# Patient Record
Sex: Male | Born: 1950
Health system: Southern US, Community
[De-identification: ages and names within clinical notes are randomized; demographics above are authoritative.]

## PROBLEM LIST (undated history)

## (undated) DIAGNOSIS — J45909 Unspecified asthma, uncomplicated: Secondary | ICD-10-CM

## (undated) DIAGNOSIS — G4730 Sleep apnea, unspecified: Secondary | ICD-10-CM

## (undated) DIAGNOSIS — E785 Hyperlipidemia, unspecified: Secondary | ICD-10-CM

## (undated) DIAGNOSIS — H409 Unspecified glaucoma: Secondary | ICD-10-CM

## (undated) DIAGNOSIS — H269 Unspecified cataract: Secondary | ICD-10-CM

## (undated) DIAGNOSIS — N189 Chronic kidney disease, unspecified: Secondary | ICD-10-CM

## (undated) DIAGNOSIS — G4733 Obstructive sleep apnea (adult) (pediatric): Secondary | ICD-10-CM

## (undated) DIAGNOSIS — M1990 Unspecified osteoarthritis, unspecified site: Secondary | ICD-10-CM

## (undated) DIAGNOSIS — C6990 Malignant neoplasm of unspecified site of unspecified eye: Secondary | ICD-10-CM

## (undated) DIAGNOSIS — I10 Essential (primary) hypertension: Secondary | ICD-10-CM

## (undated) DIAGNOSIS — C801 Malignant (primary) neoplasm, unspecified: Secondary | ICD-10-CM

## (undated) DIAGNOSIS — K219 Gastro-esophageal reflux disease without esophagitis: Secondary | ICD-10-CM

## (undated) DIAGNOSIS — E669 Obesity, unspecified: Secondary | ICD-10-CM

## (undated) HISTORY — DX: Unspecified glaucoma: H40.9

## (undated) HISTORY — PX: APPENDECTOMY: SHX54

## (undated) HISTORY — DX: Malignant neoplasm of unspecified site of unspecified eye: C69.90

## (undated) HISTORY — PX: EYE SURGERY: SHX253

## (undated) HISTORY — DX: Obstructive sleep apnea (adult) (pediatric): G47.33

## (undated) HISTORY — PX: VASECTOMY: SHX75

## (undated) HISTORY — DX: Unspecified cataract: H26.9

## (undated) HISTORY — DX: Hyperlipidemia, unspecified: E78.5

## (undated) HISTORY — DX: Obesity, unspecified: E66.9

## (undated) HISTORY — DX: Unspecified asthma, uncomplicated: J45.909

## (undated) HISTORY — PX: TRIGGER FINGER RELEASE: SHX641

---

## 2000-07-11 ENCOUNTER — Encounter (HOSPITAL_COMMUNITY): Admission: RE | Admit: 2000-07-11 | Discharge: 2000-08-10 | Payer: Self-pay | Admitting: Pulmonary Disease

## 2000-10-28 ENCOUNTER — Ambulatory Visit (HOSPITAL_COMMUNITY): Admission: RE | Admit: 2000-10-28 | Discharge: 2000-10-28 | Payer: Self-pay | Admitting: Pulmonary Disease

## 2000-12-02 ENCOUNTER — Ambulatory Visit (HOSPITAL_COMMUNITY): Admission: RE | Admit: 2000-12-02 | Discharge: 2000-12-02 | Payer: Self-pay | Admitting: Internal Medicine

## 2001-12-07 ENCOUNTER — Ambulatory Visit (HOSPITAL_COMMUNITY): Admission: RE | Admit: 2001-12-07 | Discharge: 2001-12-07 | Payer: Self-pay | Admitting: Pulmonary Disease

## 2002-11-04 ENCOUNTER — Ambulatory Visit (HOSPITAL_COMMUNITY): Admission: RE | Admit: 2002-11-04 | Discharge: 2002-11-04 | Payer: Self-pay | Admitting: Pulmonary Disease

## 2003-05-03 ENCOUNTER — Ambulatory Visit (HOSPITAL_COMMUNITY): Admission: RE | Admit: 2003-05-03 | Discharge: 2003-05-03 | Payer: Self-pay | Admitting: Ophthalmology

## 2004-02-15 ENCOUNTER — Ambulatory Visit (HOSPITAL_COMMUNITY): Admission: RE | Admit: 2004-02-15 | Discharge: 2004-02-15 | Payer: Self-pay | Admitting: Pulmonary Disease

## 2004-06-26 ENCOUNTER — Ambulatory Visit (HOSPITAL_COMMUNITY): Admission: RE | Admit: 2004-06-26 | Discharge: 2004-06-26 | Payer: Self-pay | Admitting: Pulmonary Disease

## 2004-06-27 ENCOUNTER — Ambulatory Visit (HOSPITAL_COMMUNITY): Admission: RE | Admit: 2004-06-27 | Discharge: 2004-06-27 | Payer: Self-pay | Admitting: Orthopedic Surgery

## 2004-06-27 ENCOUNTER — Ambulatory Visit (HOSPITAL_BASED_OUTPATIENT_CLINIC_OR_DEPARTMENT_OTHER): Admission: RE | Admit: 2004-06-27 | Discharge: 2004-06-27 | Payer: Self-pay | Admitting: Orthopedic Surgery

## 2004-09-17 ENCOUNTER — Ambulatory Visit (HOSPITAL_COMMUNITY): Admission: RE | Admit: 2004-09-17 | Discharge: 2004-09-17 | Payer: Self-pay | Admitting: Pulmonary Disease

## 2004-10-02 ENCOUNTER — Ambulatory Visit (HOSPITAL_COMMUNITY): Admission: RE | Admit: 2004-10-02 | Discharge: 2004-10-02 | Payer: Self-pay | Admitting: Pulmonary Disease

## 2005-01-09 ENCOUNTER — Ambulatory Visit (HOSPITAL_COMMUNITY): Admission: RE | Admit: 2005-01-09 | Discharge: 2005-01-09 | Payer: Self-pay | Admitting: Ophthalmology

## 2005-12-08 IMAGING — CR DG CHEST 2V
2 series · 2 of 2 positions shown · non-contrast
Comparison: none

CLINICAL DATA: Previously operated melanoma of the eye.  Reformed smoker.
 CHEST - 2 VIEW: 
 PA and lateral views of the chest are made and show no significant interval change compared to the previous study of 05/03/03.  There remains mild diffuse peribronchial thickening and minimal stable right apical scarring.  The heart, mediastinum, and bony thorax remain within the limits of normal.

[view not recorded (1 of 2)]
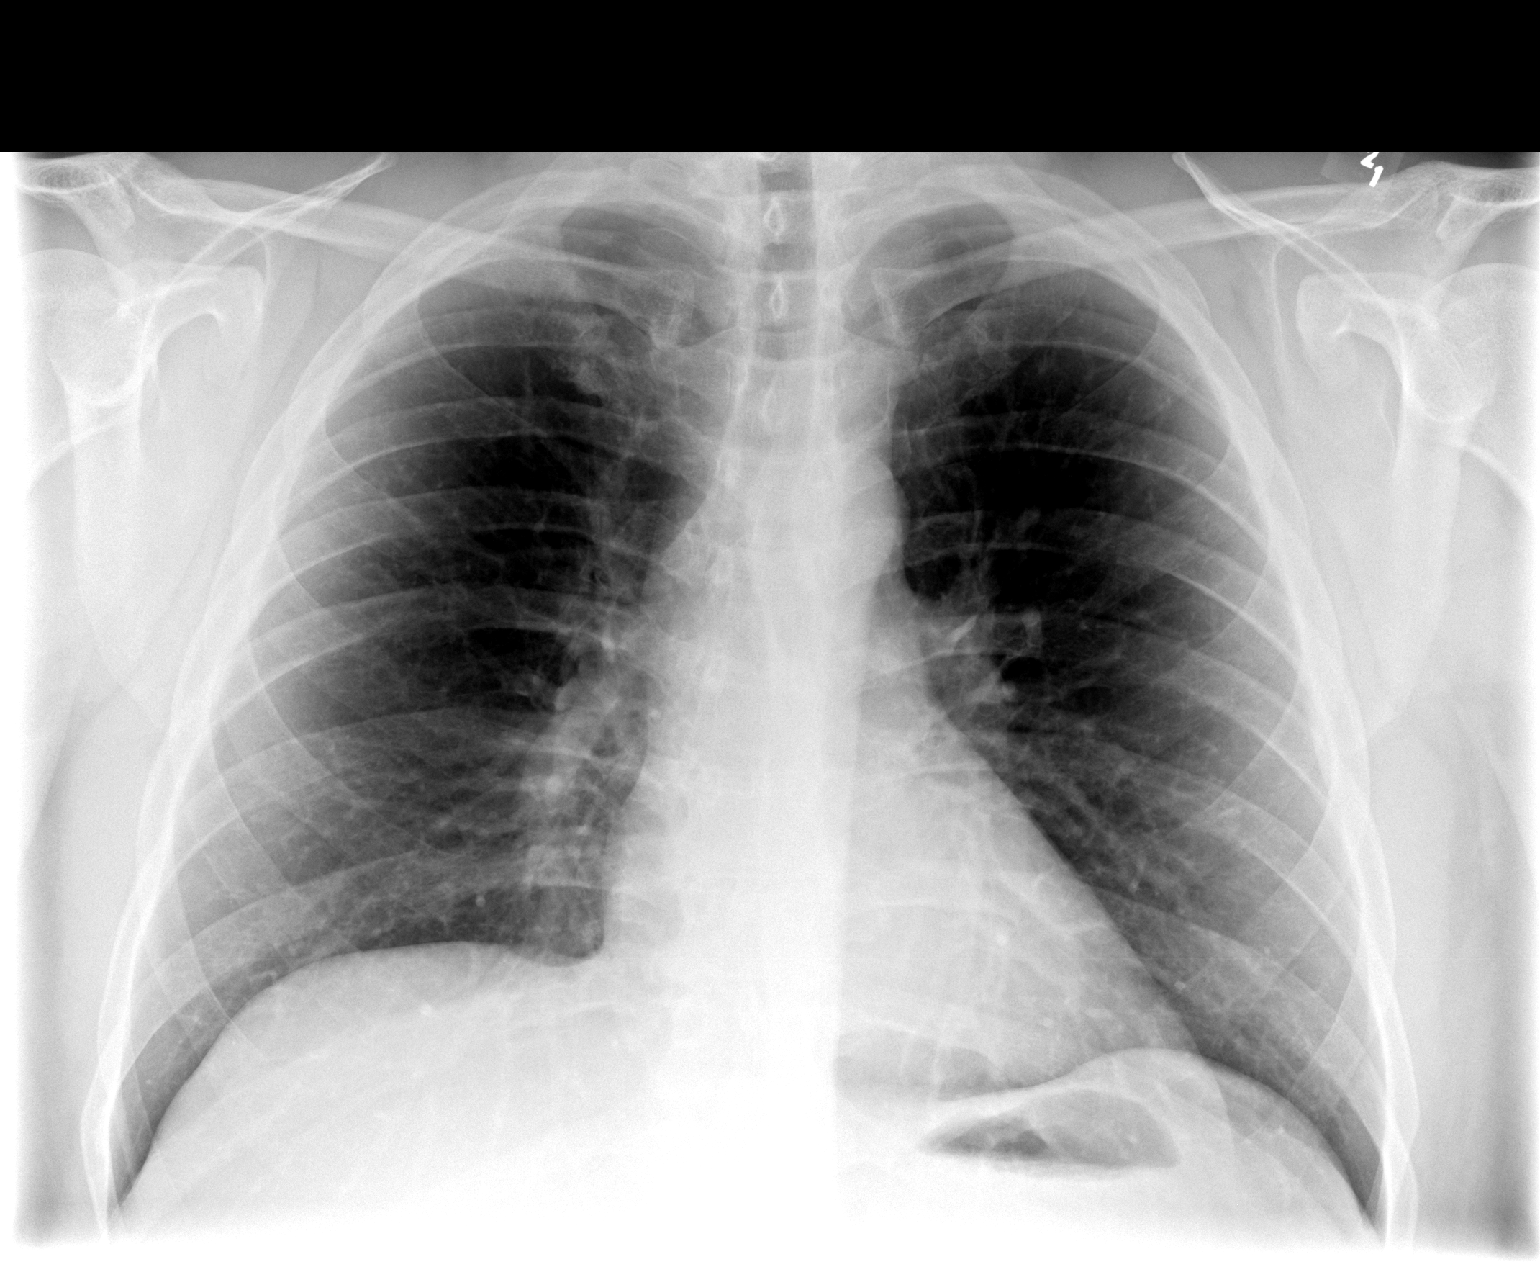

[view not recorded (2 of 2)]
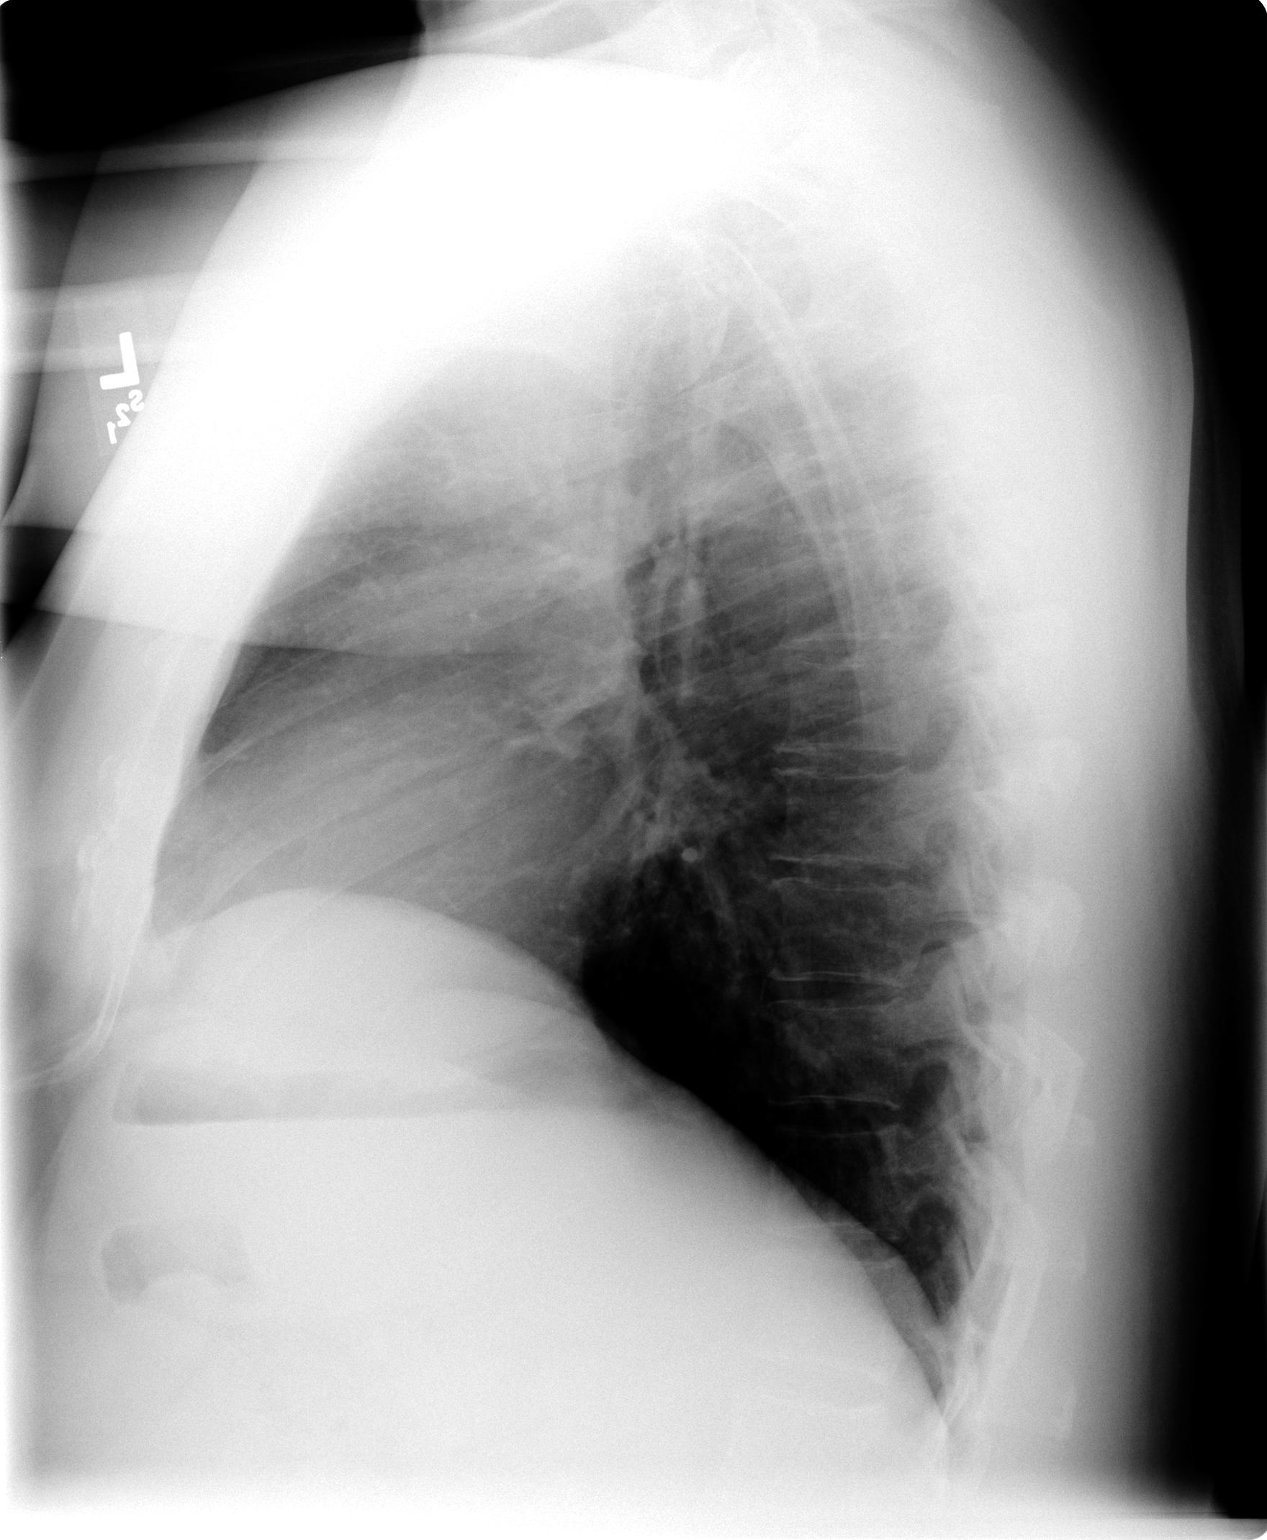

[2 of 2 positions shown; findings below may reference images not displayed]

IMPRESSION: Stable diffuse peribronchial thickening with stable scar in the medial aspect of the right apex.

## 2006-02-11 ENCOUNTER — Encounter (INDEPENDENT_AMBULATORY_CARE_PROVIDER_SITE_OTHER): Payer: Self-pay | Admitting: *Deleted

## 2006-02-11 ENCOUNTER — Ambulatory Visit: Payer: Self-pay | Admitting: Internal Medicine

## 2006-02-11 ENCOUNTER — Ambulatory Visit (HOSPITAL_COMMUNITY): Admission: RE | Admit: 2006-02-11 | Discharge: 2006-02-11 | Payer: Self-pay | Admitting: Internal Medicine

## 2006-02-18 ENCOUNTER — Ambulatory Visit (HOSPITAL_COMMUNITY): Admission: RE | Admit: 2006-02-18 | Discharge: 2006-02-18 | Payer: Self-pay | Admitting: Ophthalmology

## 2006-12-30 ENCOUNTER — Ambulatory Visit (HOSPITAL_COMMUNITY): Admission: RE | Admit: 2006-12-30 | Discharge: 2006-12-30 | Payer: Self-pay | Admitting: Ophthalmology

## 2007-12-24 ENCOUNTER — Ambulatory Visit (HOSPITAL_COMMUNITY): Admission: RE | Admit: 2007-12-24 | Discharge: 2007-12-24 | Payer: Self-pay | Admitting: Pulmonary Disease

## 2008-12-13 ENCOUNTER — Ambulatory Visit (HOSPITAL_COMMUNITY): Admission: RE | Admit: 2008-12-13 | Discharge: 2008-12-13 | Payer: Self-pay | Admitting: Pulmonary Disease

## 2010-01-11 ENCOUNTER — Ambulatory Visit (HOSPITAL_COMMUNITY): Admission: RE | Admit: 2010-01-11 | Discharge: 2010-01-11 | Payer: Self-pay | Admitting: Pulmonary Disease

## 2010-04-09 DIAGNOSIS — H3581 Retinal edema: Secondary | ICD-10-CM | POA: Insufficient documentation

## 2010-04-09 DIAGNOSIS — H43819 Vitreous degeneration, unspecified eye: Secondary | ICD-10-CM | POA: Insufficient documentation

## 2010-06-18 DIAGNOSIS — H25049 Posterior subcapsular polar age-related cataract, unspecified eye: Secondary | ICD-10-CM | POA: Insufficient documentation

## 2010-06-18 DIAGNOSIS — H3520 Other non-diabetic proliferative retinopathy, unspecified eye: Secondary | ICD-10-CM | POA: Insufficient documentation

## 2010-07-27 NOTE — Procedures (Signed)
   NAME:  Justin Montes, Justin Montes                          ACCOUNT NO.:  [id number]   MEDICAL RECORD NO.:  [id number]                   PATIENT TYPE:  OUT   LOCATION:  [facility]FTL                                 FACILITY:  [facility]   PHYSICIAN:  Edward L. Juanetta Gosling, M.D.             DATE OF BIRTH:  06-28-1950   DATE OF PROCEDURE:  12/07/2001  DATE OF DISCHARGE:                                    STRESS TEST   INDICATIONS FOR PROCEDURE:  Shortness of breath and hyperlipidemia, rule out  ischemic cardiac disease.   SUBJECTIVE:  The patient is undergoing graded exercise testing because of  shortness of breath that has been occurring with exertion and the test is  being done to rule out ischemia as a cause of this.  There are no  contraindications to undergoing graded exercise testing.   DESCRIPTION OF PROCEDURE:  The patient exercised for seven minutes 30  seconds on the Bruce protocol, reaching sustained 10.1 METS.  His maximum  recorded heart rate was 153 which is 91% of his age-predicted maximal heart  rate.  Blood pressure response to exercise was normal.  He had no symptoms  during exercise, no electrocardiographic changes suggestive of inducible  ischemia.   IMPRESSION:  1. Good exercise tolerance.  2. Normal blood pressure response to exercise.  3. No symptoms during exercise.  4. No evidence of inducible ischemia.                                               Edward L. Juanetta Gosling, M.D.    ELH/MEDQ  D:  12/07/2001  T:  12/08/2001  Job:  [id number]

## 2010-07-27 NOTE — Op Note (Signed)
NAME:  Justin Montes, Justin Montes                ACCOUNT NO.:  [id number]   MEDICAL RECORD NO.:  [id number]          PATIENT TYPE:  AMB   LOCATION:  DAY                           FACILITY:  [facility]   PHYSICIAN:  Lionel December, M.D.    DATE OF BIRTH:  12-01-1950   DATE OF PROCEDURE:  02/11/2006  DATE OF DISCHARGE:                               OPERATIVE REPORT   PROCEDURE:  Colonoscopy.   INDICATIONS:  [name] is a 60 year old Caucasian male who is here for  surveillance exam.  He had a tubular adenoma removed five years ago.  Family history is negative for colorectal carcinoma and he is free of GI  symptoms.  The procedure risks were reviewed with the patient, informed  consent was obtained.   MEDS FOR CONSCIOUS SEDATION:  Demerol 50 mg IV, Versed 5 mg IV.   FINDINGS:  The procedure was performed in the endoscopy suite.  The  patient's vital signs and O2 sat were monitored during procedure and  remained stable.  The patient was placed in the left lateral position.  Rectal examination was performed.  No abnormality was noted on external  or digital exam.  The Olympus videoscope was placed in the rectum and  advanced under vision into the sigmoid colon and beyond.  Preparation  was excellent.  The scope was passed into the cecum which was identified  by ileocecal valve and very prominent appendiceal stump which almost  looked like a polyp.  Pictures were retrieved from his last exam and  this area was very identical to the exam in September 2002.  This was  left alone.  It was previously biopsied and revealed normal mucosa.  As  the scope was withdrawn, the colonic mucosa was carefully examined.  There was a small polyp over the ileocecal valve which was ablated via  cold biopsy.  There was another small polyp at the descending colon  which was ablated in a similar fashion.  The mucosa of the rest of the  colon was normal.  Rectal mucosa similarly was normal.  The scope was  retroflexed to examine  the anorectal junction which was unremarkable.  The endoscope was straightened and withdrawn.  The patient tolerated the  procedure well.   FINAL DIAGNOSIS:  Two small polyps ablated via cold biopsy, one from the  cecum, another one from the descending colon.   RECOMMENDATIONS:  Standard instructions given.  I will be contacting the  patient with the results of the biopsy.  If the polyps are non-  adenomatous, he may consider an exam in ten years, otherwise, may come  back in 5-7.      Lionel December, M.D.  Electronically Signed     NR/MEDQ  D:  02/11/2006  T:  02/11/2006  Job:  [id number]   cc:   Ramon Dredge L. Juanetta Gosling, M.D.  Fax: [phone]

## 2010-07-27 NOTE — Op Note (Signed)
NAME:  Justin Montes, Justin Montes NO.:  [id number]   MEDICAL RECORD NO.:  [id number]          PATIENT TYPE:  AMB   LOCATION:  DSC                          FACILITY:  [facility]   PHYSICIAN:  Loreta Ave, M.D. DATE OF BIRTH:  08-Dec-1950   DATE OF PROCEDURE:  06/27/2004  DATE OF DISCHARGE:                                 OPERATIVE REPORT   PREOPERATIVE DIAGNOSES:  Triggering A1 pulley left thumb.   POSTOPERATIVE DIAGNOSES:  Triggering A1 pulley left thumb.   PROCEDURE:  Release A1 pulley left thumb.   SURGEON:  Loreta Ave, M.D.   ASSISTANT:  Genene Churn. Denton Meek.   ANESTHESIA:  IV regional.   SPECIMENS:  None.   CULTURES:  None.   COMPLICATIONS:  None.   DRESSING:  Soft compressive dressing.   DESCRIPTION OF PROCEDURE:  The patient was brought to the operating room and  after adequate anesthesia had been obtained, prepped and draped in the usual  sterile fashion. A small transverse incision volar aspect of the left thumb  over the MP joint centered over the A1 pulley. The skin and subcutaneous  tissue was divided. Neurovascular bundles identified, protected, and  retracted. A1 pulley released in its entirety proximal to distal extent  obliterating all triggering of the flexor tendon. A little attrition on the  tendon but basically intact. Once I confirmed complete obliteration of  triggering, the wound was irrigated. The skin was closed with nylon. The  margin of the wound injected with Marcaine. Sterile compressive dressing  applied. Anesthesia reversed, brought to the recovery room. Tolerated the  surgery well with no complications.      DFM/MEDQ  D:  06/27/2004  T:  06/27/2004  Job:  [id number]

## 2010-12-15 ENCOUNTER — Emergency Department (HOSPITAL_COMMUNITY): Payer: 59

## 2010-12-15 ENCOUNTER — Encounter: Payer: Self-pay | Admitting: Emergency Medicine

## 2010-12-15 ENCOUNTER — Other Ambulatory Visit: Payer: Self-pay

## 2010-12-15 ENCOUNTER — Emergency Department (HOSPITAL_COMMUNITY)
Admission: EM | Admit: 2010-12-15 | Discharge: 2010-12-15 | Disposition: A | Discharge status: Home / Self Care | Payer: 59 | Attending: Emergency Medicine | Admitting: Emergency Medicine

## 2010-12-15 DIAGNOSIS — R079 Chest pain, unspecified: Secondary | ICD-10-CM | POA: Insufficient documentation

## 2010-12-15 DIAGNOSIS — Z79899 Other long term (current) drug therapy: Secondary | ICD-10-CM | POA: Insufficient documentation

## 2010-12-15 HISTORY — DX: Malignant (primary) neoplasm, unspecified: C80.1

## 2010-12-15 LAB — CBC
MCH: 29.4 pg (ref 26.0–34.0)
MCHC: 34.3 g/dL (ref 30.0–36.0)
Platelets: 244 10*3/uL (ref 150–400)

## 2010-12-15 LAB — COMPREHENSIVE METABOLIC PANEL
ALT: 26 U/L (ref 0–53)
CO2: 27 mEq/L (ref 19–32)
Calcium: 9.2 mg/dL (ref 8.4–10.5)
Creatinine, Ser: 1.13 mg/dL (ref 0.50–1.35)
GFR calc Af Amer: 80 mL/min — ABNORMAL LOW (ref >90)
GFR calc non Af Amer: 69 mL/min — ABNORMAL LOW (ref >90)
Glucose, Bld: 122 mg/dL — ABNORMAL HIGH (ref 70–99)
Sodium: 137 mEq/L (ref 135–145)
Total Bilirubin: 0.4 mg/dL (ref 0.3–1.2)

## 2010-12-15 LAB — CARDIAC PANEL(CRET KIN+CKTOT+MB+TROPI): Total CK: 98 U/L (ref 7–232)

## 2010-12-15 MED ORDER — SODIUM CHLORIDE 0.9 % IJ SOLN: 3.0000 mL | INTRAMUSCULAR | Status: DC | PRN

## 2010-12-15 NOTE — ED Notes (Signed)
Pt c/o having a sharp chest pain one hour ago that radiated down his left arm and now c/o chest heaviness.

## 2010-12-15 NOTE — ED Provider Notes (Signed)
Scribed for Justin Hutching, MD, the patient was seen in room APA02/APA02. This chart was scribed by [employer]. The patient's care started at 09:57 CSN: [id number] Arrival date & time: 12/15/2010  9:43 AM  Chief Complaint  Patient presents with  . Chest Pain   HPI Justin Montes is a 60 y.o. male who presents to the Emergency Department complaining of sharp chest pain starting about an hour ago. Patient localizes pain to his left chest and reports it radiates into his left arm. Describes pain as sharp and stingy. Pain is currently resolved but complains of "heaviness" in the chest. "Heaviness" was initially at a 3/10 on NPS but is currently approaching 0/10. Patient denies SOB, diaphoresis or nausea. Denies tobacco use or a family history of heart problems. Per wife, patient is currently returned to baseline and looks "normal". PCP is [facility].  HPI ELEMENTS: Location: Left Chest  Onset: a.m 12/15/2010 Duration: 1 hour  Timing: intermittent Quality: "Sharp and stingy"  Context: as above  Associated symptoms: as above    Past Medical History  Diagnosis Date  . Cancer   Hypertension reflux  Past Surgical History  Procedure Date  . Eye surgery     History reviewed. No pertinent family history.  History  Substance Use Topics  . Smoking status: Not on file  . Smokeless tobacco: Not on file  . Alcohol Use: No      Review of Systems  Constitutional: Negative for diaphoresis.  Respiratory: Negative for shortness of breath.   All other systems reviewed and are negative.    Allergies  Bee venom  Home Medications  No current outpatient prescriptions on file.  BP 142/90  Pulse 95  Temp(Src) 97.9 F (36.6 C) (Oral)  Resp 18  Ht 5\' 10"  (1.778 m)  Wt 233 lb (105.688 kg)  BMI 33.43 kg/m2  SpO2 99%  Physical Exam  Nursing note and vitals reviewed. Constitutional:       Awake, alert, nontoxic appearance with baseline speech for patient.  HENT:  Head: Atraumatic.    Mouth/Throat: No oropharyngeal exudate.  Eyes: EOM are normal. Pupils are equal, round, and reactive to light. Right eye exhibits no discharge. Left eye exhibits no discharge.  Neck: Neck supple.  Cardiovascular: Normal rate, regular rhythm and normal heart sounds.   No murmur heard. Pulmonary/Chest: Effort normal and breath sounds normal. No stridor. No respiratory distress. He has no wheezes. He has no rales. He exhibits no tenderness.  Abdominal: Soft. Bowel sounds are normal. He exhibits no mass. There is no tenderness. There is no rebound.  Musculoskeletal: He exhibits no tenderness.       Baseline ROM, moves extremities with no obvious new focal weakness.  Lymphadenopathy:    He has no cervical adenopathy.  Neurological:       Awake, alert,  no facial asymmetry; tongue midline; major cranial nerves appear intact  Skin: Skin is warm and dry. No rash noted.  Psychiatric: He has a normal mood and affect.    ED Course  Procedures   OTHER DATA REVIEWED: Nursing notes, vital signs, and past medical records reviewed.    DIAGNOSTIC STUDIES: Oxygen Saturation is 99% on room air, normal by my interpretation.    Date: 12/15/2010  Rate: 94  Rhythm: normal sinus rhythm  QRS Axis: normal  Intervals: normal  ST/T Wave abnormalities: normal  Conduction Disutrbances:none  Narrative Interpretation:   Old EKG Reviewed: none available  LABS / RADIOLOGY:  Results for orders placed during the  hospital encounter of 12/15/10  CBC      Component Value Range   WBC 7.1  4.0 - 10.5 (K/uL)   RBC 5.07  4.22 - 5.81 (MIL/uL)   Hemoglobin 14.9  13.0 - 17.0 (g/dL)   HCT 56.2  13.0 - 86.5 (%)   MCV 85.8  78.0 - 100.0 (fL)   MCH 29.4  26.0 - 34.0 (pg)   MCHC 34.3  30.0 - 36.0 (g/dL)   RDW 78.4  69.6 - 29.5 (%)   Platelets 244  150 - 400 (K/uL)  POCT I-STAT TROPONIN I      Component Value Range   Troponin i, poc 0.01  0.00 - 0.08 (ng/mL)   Comment 3              Dg Chest Portable 1  View  12/15/2010  *RADIOLOGY REPORT*  Clinical Data: Chest pain  PORTABLE CHEST - 1 VIEW  Comparison: 01/11/2010  Findings: Heart size and mediastinal contours are normal.  No pleural effusion or pulmonary edema identified.  No airspace consolidation identified.  IMPRESSION:  1.  No active cardiopulmonary abnormalities.  Original Report Authenticated By: Rosealee Albee, M.D.     ED COURSE / COORDINATION OF CARE: 10:00 - EDP examined patient and discussed patient's history of present Illness and High risk factors with patient and family. EDP ordered EKG, blood work and Veterinary surgeon.  MDM: Patient had a fleeting sharp chest pain with radiation to left arm. Minimal residual heaviness. He's feeling much better in emergency department. Wife reports normal color and behavior. Screening tests normal. Recommend referral to cardiologist for stress test. These findings were discussed with patient and wife. They understand return if worse  IMPRESSION: Diagnoses that have been ruled out:  Diagnoses that are still under consideration:  Final diagnoses:     MEDICATIONS GIVEN IN THE E.D. Medications - No data to display  DISCHARGE MEDICATIONS: New Prescriptions   No medications on file    SCRIBE ATTESTATION:I personally performed the services described in this documentation, which was scribed in my presence. The recorded information has been reviewed and considered.   Justin Hutching, MD 12/15/10 1243

## 2011-02-26 ENCOUNTER — Encounter (INDEPENDENT_AMBULATORY_CARE_PROVIDER_SITE_OTHER): Payer: Self-pay | Admitting: *Deleted

## 2012-06-22 ENCOUNTER — Other Ambulatory Visit (HOSPITAL_COMMUNITY): Payer: Self-pay | Admitting: Pulmonary Disease

## 2012-06-22 ENCOUNTER — Ambulatory Visit (HOSPITAL_COMMUNITY)
Admission: RE | Admit: 2012-06-22 | Discharge: 2012-06-22 | Disposition: A | Discharge status: Home / Self Care | Payer: 59 | Source: Ambulatory Visit | Attending: Pulmonary Disease | Admitting: Pulmonary Disease

## 2012-06-22 DIAGNOSIS — R05 Cough: Secondary | ICD-10-CM | POA: Insufficient documentation

## 2012-06-22 DIAGNOSIS — R059 Cough, unspecified: Secondary | ICD-10-CM | POA: Insufficient documentation

## 2012-10-21 ENCOUNTER — Other Ambulatory Visit (HOSPITAL_COMMUNITY): Payer: Self-pay

## 2012-10-21 DIAGNOSIS — G4730 Sleep apnea, unspecified: Secondary | ICD-10-CM

## 2012-10-27 ENCOUNTER — Encounter (INDEPENDENT_AMBULATORY_CARE_PROVIDER_SITE_OTHER): Payer: Self-pay | Admitting: *Deleted

## 2012-11-05 ENCOUNTER — Encounter (INDEPENDENT_AMBULATORY_CARE_PROVIDER_SITE_OTHER): Payer: Self-pay | Admitting: *Deleted

## 2012-11-05 ENCOUNTER — Telehealth (INDEPENDENT_AMBULATORY_CARE_PROVIDER_SITE_OTHER): Payer: Self-pay | Admitting: *Deleted

## 2012-11-05 ENCOUNTER — Other Ambulatory Visit (INDEPENDENT_AMBULATORY_CARE_PROVIDER_SITE_OTHER): Payer: Self-pay | Admitting: *Deleted

## 2012-11-05 DIAGNOSIS — Z1211 Encounter for screening for malignant neoplasm of colon: Secondary | ICD-10-CM

## 2012-11-05 DIAGNOSIS — Z86010 Personal history of colonic polyps: Secondary | ICD-10-CM

## 2012-11-05 MED ORDER — PEG-KCL-NACL-NASULF-NA ASC-C 100 G PO SOLR: 1.0000 | Freq: Once | ORAL | Status: DC

## 2012-11-05 NOTE — Telephone Encounter (Signed)
Patient needs movi prep -- nkda 

## 2012-11-13 ENCOUNTER — Ambulatory Visit: Payer: 59 | Attending: Pulmonary Disease | Admitting: Sleep Medicine

## 2012-11-13 DIAGNOSIS — G4733 Obstructive sleep apnea (adult) (pediatric): Secondary | ICD-10-CM | POA: Insufficient documentation

## 2012-11-13 DIAGNOSIS — G4730 Sleep apnea, unspecified: Secondary | ICD-10-CM

## 2012-11-13 DIAGNOSIS — Z6837 Body mass index (BMI) 37.0-37.9, adult: Secondary | ICD-10-CM | POA: Insufficient documentation

## 2012-11-14 NOTE — Procedures (Signed)
[facility] Mckinze Poirier A. Gerilyn Pilgrim, MD     [email or web address]        NAME:  Justin Montes, Justin Montes                ACCOUNT NO.:  [id number]  MEDICAL RECORD NO.:  [id number]          PATIENT TYPE:  OUT  LOCATION:  SLEEP LAB                     FACILITY:  [facility]  PHYSICIAN:  Cameryn Schum A. Gerilyn Pilgrim, M.D. DATE OF BIRTH:  19-Sep-1950  DATE OF STUDY:  11/13/2012                           NOCTURNAL POLYSOMNOGRAM  REFERRING PHYSICIAN:  Ramon Dredge L. Juanetta Gosling, M.D.   INDICATION FOR STUDY:  A 62 year old man who presents with fatigue, hypersomnia, and snoring.   MEDICATIONS:  Nexium, losartan, cyclobenzaprine, atorvastatin, glucosamine, Centrum Silver, fish oil, vitamin D.  EPWORTH SLEEPINESS SCALE:  7.  BMI:  37.  ARCHITECTURAL SUMMARY:  This is a split night recording with the initial portion being a diagnostic and the second portion a titration recording. The total recording time is 434 minutes.  Sleep efficiency 80%.  Sleep latency 15 minutes.  REM latency 291 minutes.  RESPIRATORY SUMMARY:  Baseline oxygen saturation is 96.  Lowest saturation is 77 during non-REM sleep.  Diagnostic AHI is 60.  The patient was placed on positive pressure starting at 5 and increased to 10.  Optimal pressure is 10 with resolution of obstructive events and good tolerance.  LIMB MOVEMENT SUMMARY:  PLM index 0.  ELECTROCARDIOGRAM SUMMARY:  Average heart rate is 74 with no significant dysrhythmias observed.  IMPRESSION:  Severe obstructive sleep apnea syndrome, which responds well to a continuous positive airway pressure of 10.  Thanks for this referral.      Janiel Derhammer A. Gerilyn Pilgrim, M.D.    KAD/MEDQ  D:  11/14/2012 18:15:10  T:  11/14/2012 18:35:34  Job:  [id number]

## 2012-12-09 ENCOUNTER — Telehealth (INDEPENDENT_AMBULATORY_CARE_PROVIDER_SITE_OTHER): Payer: Self-pay | Admitting: *Deleted

## 2012-12-09 NOTE — Telephone Encounter (Signed)
  Procedure: tcs  Reason/Indication:  Hx polyps  Has patient had this procedure before?  Yes, 2007 (EPIC)  If so, when, by whom and where?    Is there a family history of colon cancer?  no  Who?  What age when diagnosed?    Is patient diabetic?   no      Does patient have prosthetic heart valve?  no  Do you have a pacemaker?  no  Has patient ever had endocarditis? no  Has patient had joint replacement within last 12 months?  no  Does patient tend to be constipated or take laxatives?   Is patient on Coumadin, Plavix and/or Aspirin? yes  Medications: asa 81 mg 3 times a week, losartan 50 mg daily, atorvastatin 20 mg daily, nexium 40 mg daily, cosamin DS daily, centrum silver daily, mega red daily, vit d3 daily, avstin eye injection every 10 weeks  Allergies:   Medication Adjustment: asa 2 days  Procedure date & time: 12/31/12 at 730

## 2012-12-10 NOTE — Telephone Encounter (Signed)
agree

## 2012-12-25 ENCOUNTER — Encounter (HOSPITAL_COMMUNITY): Payer: Self-pay

## 2012-12-31 ENCOUNTER — Encounter (HOSPITAL_COMMUNITY)
Admission: RE | Disposition: A | Discharge status: Home / Self Care | Payer: Self-pay | Source: Ambulatory Visit | Attending: Internal Medicine

## 2012-12-31 ENCOUNTER — Encounter (HOSPITAL_COMMUNITY): Payer: Self-pay | Admitting: *Deleted

## 2012-12-31 ENCOUNTER — Ambulatory Visit (HOSPITAL_COMMUNITY)
Admission: RE | Admit: 2012-12-31 | Discharge: 2012-12-31 | Disposition: A | Discharge status: Home / Self Care | Payer: 59 | Source: Ambulatory Visit | Attending: Internal Medicine | Admitting: Internal Medicine

## 2012-12-31 DIAGNOSIS — Z860100 Personal history of colon polyps, unspecified: Secondary | ICD-10-CM | POA: Insufficient documentation

## 2012-12-31 DIAGNOSIS — D126 Benign neoplasm of colon, unspecified: Secondary | ICD-10-CM | POA: Insufficient documentation

## 2012-12-31 DIAGNOSIS — I10 Essential (primary) hypertension: Secondary | ICD-10-CM | POA: Insufficient documentation

## 2012-12-31 HISTORY — DX: Unspecified osteoarthritis, unspecified site: M19.90

## 2012-12-31 HISTORY — DX: Hyperlipidemia, unspecified: E78.5

## 2012-12-31 HISTORY — PX: COLONOSCOPY: SHX5424

## 2012-12-31 HISTORY — DX: Gastro-esophageal reflux disease without esophagitis: K21.9

## 2012-12-31 HISTORY — DX: Sleep apnea, unspecified: G47.30

## 2012-12-31 HISTORY — DX: Essential (primary) hypertension: I10

## 2012-12-31 HISTORY — DX: Chronic kidney disease, unspecified: N18.9

## 2012-12-31 SURGERY — COLONOSCOPY
Anesthesia: Moderate Sedation

## 2012-12-31 MED ORDER — MIDAZOLAM HCL 5 MG/5ML IJ SOLN
INTRAMUSCULAR | Status: DC | PRN
  Administered 2012-12-31: 1 mg via INTRAVENOUS
  Administered 2012-12-31 (×2): 2 mg via INTRAVENOUS

## 2012-12-31 MED ORDER — MEPERIDINE HCL 50 MG/ML IJ SOLN
INTRAMUSCULAR | Status: AC
  Filled 2012-12-31: qty 1

## 2012-12-31 MED ORDER — MEPERIDINE HCL 50 MG/ML IJ SOLN
INTRAMUSCULAR | Status: DC | PRN
  Administered 2012-12-31 (×2): 25 mg via INTRAVENOUS

## 2012-12-31 MED ORDER — MIDAZOLAM HCL 5 MG/5ML IJ SOLN
INTRAMUSCULAR | Status: AC
  Filled 2012-12-31: qty 10

## 2012-12-31 MED ORDER — STERILE WATER FOR IRRIGATION IR SOLN
Status: DC | PRN
  Administered 2012-12-31: 08:00:00

## 2012-12-31 MED ORDER — SIMETHICONE 40 MG/0.6ML PO SUSP
ORAL | Status: AC
  Filled 2012-12-31: qty 0.6

## 2012-12-31 MED ORDER — SODIUM CHLORIDE 0.9 % IV SOLN
INTRAVENOUS | Status: DC
  Administered 2012-12-31: 1000 mL via INTRAVENOUS

## 2012-12-31 NOTE — H&P (Signed)
Justin Montes is an 62 y.o. male.   Chief Complaint: Patient is here for colonoscopy. HPI: Patient is 62 year old Caucasian male with history of colonic adenomas visit for surveillance colonoscopy. His last exam was in December 2007 with removal of 2 small polyps and these are serrated adenomas. He denies abdominal pain change in bowel habits or rectal bleeding. Family history is negative for colorectal carcinoma.  Past Medical History  Diagnosis Date  . Cancer     melona of the right eye  . Sleep apnea     10 on CPAP  . Hypertension   . Hyperlipemia   . Chronic kidney disease     kidney stone age 67  . GERD (gastroesophageal reflux disease)   . Arthritis     Past Surgical History  Procedure Laterality Date  . Eye surgery    . Trigger finger release      right and left thumbs  . Appendectomy      age 54    History reviewed. No pertinent family history. Social History:  reports that he quit smoking about 39 years ago. His smoking use included Cigarettes. He smoked 0.00 packs per day. He does not have any smokeless tobacco history on file. He reports that he does not drink alcohol or use illicit drugs.  Allergies:  Allergies  Allergen Reactions  . Bee Venom Swelling    Medications Prior to Admission  Medication Sig Dispense Refill  . aspirin EC 81 MG tablet Take 81 mg by mouth 3 (three) times a week. Take on Monday, Wednesday, and Friday       . atorvastatin (LIPITOR) 20 MG tablet Take 20 mg by mouth every morning.        . Cholecalciferol (VITAMIN D PO) Take 1 tablet by mouth daily.      Marland Kitchen esomeprazole (NEXIUM) 40 MG capsule Take 40 mg by mouth daily before breakfast.        . Glucosamine-Chondroit-Vit C-Mn (GLUCOSAMINE-CHONDROITIN) TABS Take 1 tablet by mouth 2 (two) times daily. Strength: 1500mg /1200mg        . Krill Oil CAPS Take 1 capsule by mouth daily. Mega Red krill capsules       . losartan (COZAAR) 50 MG tablet Take 50 mg by mouth daily.      . multivitamin  (ONE-A-DAY MEN'S) TABS Take 1 tablet by mouth every morning.        . peg 3350 powder (MOVIPREP) 100 G SOLR Take 1 kit (200 g total) by mouth once.  1 kit  0  . Bevacizumab (AVASTIN IV) Inject into the vein See admin instructions. Gets every 10 weeks        No results found for this or any previous visit (from the past 48 hour(s)). No results found.  ROS  Blood pressure 144/83, pulse 91, temperature 97.9 F (36.6 C), temperature source Oral, resp. rate 20, height 5\' 10"  (1.778 m), weight 248 lb (112.492 kg), SpO2 97.00%. Physical Exam  Constitutional: He appears well-developed and well-nourished.  HENT:  Mouth/Throat: Oropharynx is clear and moist.  Eyes: Conjunctivae are normal. No scleral icterus.  Neck: No thyromegaly present.  Cardiovascular: Normal rate, regular rhythm and normal heart sounds.   No murmur heard. Respiratory: Effort normal and breath sounds normal.  GI: Soft. He exhibits no distension and no mass. There is no tenderness.  Musculoskeletal: He exhibits no edema.  Lymphadenopathy:    He has no cervical adenopathy.  Neurological: He is alert.  Skin: Skin is warm and  dry.     Assessment/Plan History of colonic adenomas. Surveillance colonoscopy.  Justin Montes U 12/31/2012, 7:27 AM

## 2012-12-31 NOTE — Op Note (Signed)
COLONOSCOPY PROCEDURE REPORT  PATIENT:  Justin Montes  MR#:  [id number] Birthdate:  12/18/1950, 62 y.o., male Endoscopist:  Dr. Malissa Hippo, MD Referred By:  Dr. Oneal Deputy. Juanetta Gosling, MD  Procedure Date: 12/31/2012  Procedure:   Colonoscopy  Indications: Patient is 62 year old Caucasian male with history of colonic adenomas. His last exam was in December 2007 with removal of 2 small serrated adenomas.  Informed Consent:  The procedure and risks were reviewed with the patient and informed consent was obtained.  Medications:  Demerol 50 mg IV Versed 5 mg IV  Description of procedure:  After a digital rectal exam was performed, that colonoscope was advanced from the anus through the rectum and colon to the area of the cecum, ileocecal valve and appendiceal orifice. The cecum was deeply intubated. These structures were well-seen and photographed for the record. From the level of the cecum and ileocecal valve, the scope was slowly and cautiously withdrawn. The mucosal surfaces were carefully surveyed utilizing scope tip to flexion to facilitate fold flattening as needed. The scope was pulled down into the rectum where a thorough exam including retroflexion was performed.  Findings:   Prep excellent. Small periappendiceal polyp ablated via cold biopsy. Mucosa of the rest of the colon and rectum was normal. Unremarkable anorectal junction.   Therapeutic/Diagnostic Maneuvers Performed:  See above  Complications:  None  Cecal Withdrawal Time:  11 minutes  Impression:  Examination performed to cecum. Small cecal polyp ablated via cold biopsy.  Recommendations:  Standard instructions given. I will contact patient with biopsy results and further recommendations.  Maxxon Schwanke U  12/31/2012 7:56 AM  CC: Dr. Fredirick Maudlin, MD & Dr. Bonnetta Barry ref. provider found

## 2013-01-05 ENCOUNTER — Encounter (HOSPITAL_COMMUNITY): Payer: Self-pay | Admitting: Internal Medicine

## 2013-01-13 ENCOUNTER — Encounter (INDEPENDENT_AMBULATORY_CARE_PROVIDER_SITE_OTHER): Payer: Self-pay | Admitting: *Deleted

## 2013-08-17 ENCOUNTER — Emergency Department (HOSPITAL_COMMUNITY)
Admission: EM | Admit: 2013-08-17 | Discharge: 2013-08-17 | Disposition: A | Discharge status: Home / Self Care | Payer: 59 | Attending: Emergency Medicine | Admitting: Emergency Medicine

## 2013-08-17 ENCOUNTER — Encounter (HOSPITAL_COMMUNITY): Payer: Self-pay | Admitting: Emergency Medicine

## 2013-08-17 ENCOUNTER — Emergency Department (HOSPITAL_COMMUNITY): Payer: 59

## 2013-08-17 DIAGNOSIS — N201 Calculus of ureter: Secondary | ICD-10-CM | POA: Insufficient documentation

## 2013-08-17 DIAGNOSIS — M129 Arthropathy, unspecified: Secondary | ICD-10-CM | POA: Insufficient documentation

## 2013-08-17 DIAGNOSIS — Z87891 Personal history of nicotine dependence: Secondary | ICD-10-CM | POA: Insufficient documentation

## 2013-08-17 DIAGNOSIS — E785 Hyperlipidemia, unspecified: Secondary | ICD-10-CM | POA: Insufficient documentation

## 2013-08-17 DIAGNOSIS — N23 Unspecified renal colic: Secondary | ICD-10-CM | POA: Insufficient documentation

## 2013-08-17 DIAGNOSIS — I129 Hypertensive chronic kidney disease with stage 1 through stage 4 chronic kidney disease, or unspecified chronic kidney disease: Secondary | ICD-10-CM | POA: Insufficient documentation

## 2013-08-17 DIAGNOSIS — K219 Gastro-esophageal reflux disease without esophagitis: Secondary | ICD-10-CM | POA: Insufficient documentation

## 2013-08-17 DIAGNOSIS — G4730 Sleep apnea, unspecified: Secondary | ICD-10-CM | POA: Insufficient documentation

## 2013-08-17 DIAGNOSIS — Z79899 Other long term (current) drug therapy: Secondary | ICD-10-CM | POA: Insufficient documentation

## 2013-08-17 DIAGNOSIS — Z9089 Acquired absence of other organs: Secondary | ICD-10-CM | POA: Insufficient documentation

## 2013-08-17 DIAGNOSIS — N189 Chronic kidney disease, unspecified: Secondary | ICD-10-CM | POA: Insufficient documentation

## 2013-08-17 DIAGNOSIS — Z9889 Other specified postprocedural states: Secondary | ICD-10-CM | POA: Insufficient documentation

## 2013-08-17 DIAGNOSIS — Z85840 Personal history of malignant neoplasm of eye: Secondary | ICD-10-CM | POA: Insufficient documentation

## 2013-08-17 LAB — CBC WITH DIFFERENTIAL/PLATELET
BASOS ABS: 0 10*3/uL (ref 0.0–0.1)
BASOS PCT: 0 % (ref 0–1)
Eosinophils Absolute: 0 10*3/uL (ref 0.0–0.7)
Eosinophils Relative: 0 % (ref 0–5)
HEMATOCRIT: 43.2 % (ref 39.0–52.0)
Hemoglobin: 14.7 g/dL (ref 13.0–17.0)
Lymphocytes Relative: 15 % (ref 12–46)
Lymphs Abs: 1.3 10*3/uL (ref 0.7–4.0)
MCH: 29.6 pg (ref 26.0–34.0)
MCHC: 34 g/dL (ref 30.0–36.0)
MCV: 87.1 fL (ref 78.0–100.0)
MONO ABS: 0.5 10*3/uL (ref 0.1–1.0)
Monocytes Relative: 6 % (ref 3–12)
NEUTROS ABS: 6.6 10*3/uL (ref 1.7–7.7)
NEUTROS PCT: 79 % — AB (ref 43–77)
PLATELETS: 229 10*3/uL (ref 150–400)
RBC: 4.96 MIL/uL (ref 4.22–5.81)
RDW: 12.9 % (ref 11.5–15.5)
WBC: 8.4 10*3/uL (ref 4.0–10.5)

## 2013-08-17 LAB — BASIC METABOLIC PANEL
BUN: 13 mg/dL (ref 6–23)
CO2: 26 mEq/L (ref 19–32)
CREATININE: 1.16 mg/dL (ref 0.50–1.35)
Calcium: 9.1 mg/dL (ref 8.4–10.5)
Chloride: 102 mEq/L (ref 96–112)
GFR calc non Af Amer: 65 mL/min — ABNORMAL LOW (ref >90)
GFR, EST AFRICAN AMERICAN: 76 mL/min — AB (ref >90)
Glucose, Bld: 131 mg/dL — ABNORMAL HIGH (ref 70–99)
Potassium: 4.4 mEq/L (ref 3.7–5.3)
Sodium: 142 mEq/L (ref 137–147)

## 2013-08-17 LAB — URINE MICROSCOPIC-ADD ON

## 2013-08-17 LAB — URINALYSIS, ROUTINE W REFLEX MICROSCOPIC
Bilirubin Urine: NEGATIVE
GLUCOSE, UA: NEGATIVE mg/dL
Ketones, ur: NEGATIVE mg/dL
Nitrite: NEGATIVE
PROTEIN: NEGATIVE mg/dL
Specific Gravity, Urine: 1.01 (ref 1.005–1.030)
UROBILINOGEN UA: 0.2 mg/dL (ref 0.0–1.0)
pH: 6.5 (ref 5.0–8.0)

## 2013-08-17 MED ORDER — TAMSULOSIN HCL 0.4 MG PO CAPS: ORAL_CAPSULE | ORAL | Status: DC

## 2013-08-17 MED ORDER — OXYCODONE-ACETAMINOPHEN 5-325 MG PO TABS: 1.0000 | ORAL_TABLET | ORAL | Status: DC | PRN

## 2013-08-17 MED ORDER — ONDANSETRON HCL 8 MG PO TABS: 8.0000 mg | ORAL_TABLET | Freq: Three times a day (TID) | ORAL | Status: DC | PRN

## 2013-08-17 NOTE — Discharge Instructions (Signed)
Strain the urine, to catch the stone. Call the urologist for followup appointment within one week.    Kidney Stones Kidney stones (urolithiasis) are deposits that form inside your kidneys. The intense pain is caused by the stone moving through the urinary tract. When the stone moves, the ureter goes into spasm around the stone. The stone is usually passed in the urine.  CAUSES   A disorder that makes certain neck glands produce too much parathyroid hormone (primary hyperparathyroidism).  A buildup of uric acid crystals, similar to gout in your joints.  Narrowing (stricture) of the ureter.  A kidney obstruction present at birth (congenital obstruction).  Previous surgery on the kidney or ureters.  Numerous kidney infections. SYMPTOMS   Feeling sick to your stomach (nauseous).  Throwing up (vomiting).  Blood in the urine (hematuria).  Pain that usually spreads (radiates) to the groin.  Frequency or urgency of urination. DIAGNOSIS   Taking a history and physical exam.  Blood or urine tests.  CT scan.  Occasionally, an examination of the inside of the urinary bladder (cystoscopy) is performed. TREATMENT   Observation.  Increasing your fluid intake.  Extracorporeal shock wave lithotripsy This is a noninvasive procedure that uses shock waves to break up kidney stones.  Surgery may be needed if you have severe pain or persistent obstruction. There are various surgical procedures. Most of the procedures are performed with the use of small instruments. Only small incisions are needed to accommodate these instruments, so recovery time is minimized. The size, location, and chemical composition are all important variables that will determine the proper choice of action for you. Talk to your health care provider to better understand your situation so that you will minimize the risk of injury to yourself and your kidney.  HOME CARE INSTRUCTIONS   Drink enough water and fluids to  keep your urine clear or pale yellow. This will help you to pass the stone or stone fragments.  Strain all urine through the provided strainer. Keep all particulate matter and stones for your health care provider to see. The stone causing the pain may be as small as a grain of salt. It is very important to use the strainer each and every time you pass your urine. The collection of your stone will allow your health care provider to analyze it and verify that a stone has actually passed. The stone analysis will often identify what you can do to reduce the incidence of recurrences.  Only take over-the-counter or prescription medicines for pain, discomfort, or fever as directed by your health care provider.  Make a follow-up appointment with your health care provider as directed.  Get follow-up X-rays if required. The absence of pain does not always mean that the stone has passed. It may have only stopped moving. If the urine remains completely obstructed, it can cause loss of kidney function or even complete destruction of the kidney. It is your responsibility to make sure X-rays and follow-ups are completed. Ultrasounds of the kidney can show blockages and the status of the kidney. Ultrasounds are not associated with any radiation and can be performed easily in a matter of minutes. SEEK MEDICAL CARE IF:  You experience pain that is progressive and unresponsive to any pain medicine you have been prescribed. SEEK IMMEDIATE MEDICAL CARE IF:   Pain cannot be controlled with the prescribed medicine.  You have a fever or shaking chills.  The severity or intensity of pain increases over 18 hours and is not  relieved by pain medicine.  You develop a new onset of abdominal pain.  You feel faint or pass out.  You are unable to urinate. MAKE SURE YOU:   Understand these instructions.  Will watch your condition.  Will get help right away if you are not doing well or get worse. Document Released:  02/25/2005 Document Revised: 10/28/2012 Document Reviewed: 07/29/2012 [facility] Patient Information 2014 [employer].

## 2013-08-17 NOTE — ED Notes (Signed)
Woke up this am with aching pain to left side that radiated to back.  Had kidney stones about 30 years ago.  Presently rating pain 3.  Dr Eulis Foster in, wife at bedside.

## 2013-08-17 NOTE — ED Provider Notes (Signed)
CSN: [id number]     Arrival date & time 08/17/13  0930 History  This chart was scribed for Justin Blade, MD by Roe Coombs, ED Scribe. The patient was seen in room APA09/APA09. Patient's care was started at 9:50 AM.  Chief Complaint  Patient presents with  . Flank Pain    The history is provided by the patient. No language interpreter was used.    HPI Comments: Justin Montes is a 63 y.o. male who presents to the Emergency Department complaining of intermittent left flank pain that began yesterday. Pain radiates to his left lower abdomen. The pain does not radiate to his testicles. There are no known modifying factors. There is associated nausea.  Patient denies dysuria, urinary frequency, hematuria or vomiting. He denies diarrhea, fever, chills, cough, shortness of breath or chest pain. He had a kidney stone remotely. He has not had abdominal or back surgeries. He denies any obvious injury or trauma to his back.    Past Medical History  Diagnosis Date  . Cancer     melona of the right eye  . Sleep apnea     10 on CPAP  . Hypertension   . Hyperlipemia   . Chronic kidney disease     kidney stone age 35  . GERD (gastroesophageal reflux disease)   . Arthritis    Past Surgical History  Procedure Laterality Date  . Eye surgery    . Trigger finger release      right and left thumbs  . Appendectomy      age 62  . Colonoscopy N/A 12/31/2012    Procedure: COLONOSCOPY;  Surgeon: Rogene Houston, MD;  Location: [facility];  Service: Endoscopy;  Laterality: N/A;  730   History reviewed. No pertinent family history. History  Substance Use Topics  . Smoking status: Former Smoker    Types: Cigarettes    Quit date: 03/11/1973  . Smokeless tobacco: Not on file  . Alcohol Use: No    Review of Systems  Constitutional: Negative for fever and chills.  Respiratory: Negative for shortness of breath.   Cardiovascular: Negative for chest pain.  Gastrointestinal: Positive for nausea and  abdominal pain. Negative for vomiting and diarrhea.  Genitourinary: Positive for flank pain. Negative for dysuria, frequency and hematuria.  All other systems reviewed and are negative.   Allergies  Bee venom and Hydrocodone  Home Medications   Prior to Admission medications   Medication Sig Start Date End Date Taking? Authorizing Provider  atorvastatin (LIPITOR) 20 MG tablet Take 20 mg by mouth every morning.     Yes Historical Provider, MD  Coenzyme Q10 (CO Q-10) 200 MG CAPS Take 1 capsule by mouth daily.   Yes Historical Provider, MD  esomeprazole (NEXIUM) 40 MG capsule Take 40 mg by mouth daily before breakfast.     Yes Historical Provider, MD  Glucosamine-Chondroit-Vit C-Mn (GLUCOSAMINE-CHONDROITIN) TABS Take 1 tablet by mouth 2 (two) times daily. Strength: 1500mg /1200mg     Yes Historical Provider, MD  ibuprofen (ADVIL,MOTRIN) 200 MG tablet Take 600 mg by mouth every 6 (six) hours as needed for mild pain.   Yes Historical Provider, MD  Astrid Drafts CAPS Take 1 capsule by mouth daily. Mega Red krill capsules    Yes Historical Provider, MD  loratadine-pseudoephedrine (CLARITIN-D 24-HOUR) 10-240 MG per 24 hr tablet Take 1 tablet by mouth daily.   Yes Historical Provider, MD  losartan (COZAAR) 50 MG tablet Take 50 mg by mouth daily.   Yes  Historical Provider, MD  multivitamin (ONE-A-DAY MEN'S) TABS Take 1 tablet by mouth every morning.     Yes Historical Provider, MD  Bevacizumab (AVASTIN IV) Inject into the vein See admin instructions. Gets every 10 weeks    Historical Provider, MD  ondansetron (ZOFRAN) 8 MG tablet Take 1 tablet (8 mg total) by mouth every 8 (eight) hours as needed for nausea or vomiting. 08/17/13   Justin Blade, MD  oxyCODONE-acetaminophen (PERCOCET) 5-325 MG per tablet Take 1 tablet by mouth every 4 (four) hours as needed for severe pain. 08/17/13   Justin Blade, MD  tamsulosin (FLOMAX) 0.4 MG CAPS capsule 1 q HS to aid stone passage 08/17/13   Justin Blade, MD    Physical Exam  Nursing note and vitals reviewed. Constitutional: He is oriented to person, place, and time. He appears well-developed and well-nourished.  HENT:  Head: Normocephalic and atraumatic.  Right Ear: External ear normal.  Left Ear: External ear normal.  Eyes: Conjunctivae and EOM are normal. Pupils are equal, round, and reactive to light.  Neck: Normal range of motion and phonation normal. Neck supple.  Cardiovascular: Normal rate, regular rhythm, normal heart sounds and intact distal pulses.   Pulmonary/Chest: Effort normal and breath sounds normal. He exhibits no bony tenderness.  Abdominal: Soft. There is no tenderness.  No pulsatile mass.   Genitourinary:  No CVA tenderness.  Musculoskeletal: Normal range of motion.  Neurological: He is alert and oriented to person, place, and time. No cranial nerve deficit or sensory deficit. He exhibits normal muscle tone. Coordination normal.  Skin: Skin is warm, dry and intact.  Psychiatric: He has a normal mood and affect. His behavior is normal. Judgment and thought content normal.    ED Course  Procedures (including critical care time)   COORDINATION OF CARE: Medications - No data to display  Patient Vitals for the past 24 hrs:  BP Temp Temp src Pulse Resp SpO2  08/17/13 0940 156/99 mmHg 98.2 F (36.8 C) Oral 91 21 100 %   9:59 AM Reevaluation with update and discussion.  11:42 AM - Updated patient on imaging results which show left kidney stone in the ureter. Will give medicines to help patient pass stone. Also advised follow up with urology in the next 1-2 weeks.     Labs Review Labs Reviewed  URINALYSIS, ROUTINE W REFLEX MICROSCOPIC - Abnormal; Notable for the following:    Hgb urine dipstick LARGE (*)    Leukocytes, UA TRACE (*)    All other components within normal limits  CBC WITH DIFFERENTIAL - Abnormal; Notable for the following:    Neutrophils Relative % 79 (*)    All other components within normal  limits  BASIC METABOLIC PANEL - Abnormal; Notable for the following:    Glucose, Bld 131 (*)    GFR calc non Af Amer 65 (*)    GFR calc Af Amer 76 (*)    All other components within normal limits  URINE CULTURE  URINE MICROSCOPIC-ADD ON    Imaging Review Ct Abdomen Pelvis Wo Contrast  08/17/2013   CLINICAL DATA:  Flank pain  EXAM: CT ABDOMEN AND PELVIS WITHOUT CONTRAST  TECHNIQUE: Multidetector CT imaging of the abdomen and pelvis was performed following the standard protocol without oral or intravenous contrast material administration.  COMPARISON:  None.  FINDINGS: On axial slice 8 series 3, there is a 2 mm nodular opacity in the the inferior lingula. On axial slice 14 series 3, there is  a 2 mm nodular opacity abutting the pleura in the anterior segment of the left lower lobe. Elsewhere lung bases are clear.  Liver is prominent measuring 21.5 cm in length. There is diffuse fatty change in the liver. No focal liver lesions are identified on this noncontrast enhanced study. There is no appreciable biliary duct dilatation. Gallbladder wall is not thickened.  Spleen, pancreas, and adrenals appear normal.  In the right kidney, there is a parapelvic cyst measuring 1.4 x 1.0 cm. No other right renal masses are identified. There is no hydronephrosis or calculus on the right. There is no right-sided ureteral calculus or ureterectasis.  The left kidney appears mildly edematous. There is mild hydronephrosis on the left. There is no renal mass or calculus in the left kidney. There is a 4 mm calculus in the proximal left ureter at the level of L4. No other ureteral calculus is identified.  In the pelvis, the urinary bladder is largely distended. There is fat in both inguinal rings. There is no pelvic mass or fluid collection. The appendix is absent. There is no right lower quadrant inflammation.  There is no bowel obstruction. No free air or portal venous air. There is no ascites, adenopathy, or abscess in the an or  pelvis. There is atherosclerotic change in the aorta but no aneurysm. There are no blastic or lytic bone lesions. There is degenerative change in the lumbar spine.  IMPRESSION: 4 mm calculus in the left ureter at the level of L4 causing mild hydronephrosis and left renal edema. No intrarenal calculi are identified on either side. There is no hydronephrosis on the right.  The liver is enlarged with diffuse fatty change.  There are small pulmonary basilar nodular lesions as described above. Followup of these nodular opacity should be based on Fleischner Society guidelines. If the patient is at high risk for bronchogenic carcinoma, follow-up chest CT at 1 year is recommended. If the patient is at low risk, no follow-up is needed. This recommendation follows the consensus statement: Guidelines for Management of Small Pulmonary Nodules Detected on CT Scans: A Statement from the [employer] as published in Radiology 2005; 237:395-400.  No abscess.  No bowel obstruction.  Appendix absent.   Electronically Signed   By: Lowella Grip M.D.   On: 08/17/2013 10:42     EKG Interpretation None      MDM   Final diagnoses:  Ureteral colic  Ureteral stone    Ureteral colic with small stone that should pass, within 72 hours. No evidence for urinary tract infection, systemic infection, or acute renal insufficiency  Nursing Notes Reviewed/ Care Coordinated Applicable Imaging Reviewed Interpretation of Laboratory Data incorporated into ED treatment  The patient appears reasonably screened and/or stabilized for discharge and I doubt any other medical condition or other [facility] requiring further screening, evaluation, or treatment in the ED at this time prior to discharge.  Plan: Home Medications- Percocet, Flomax; Home Treatments- rest, strain fluid; return here if the recommended treatment, does not improve the symptoms; Recommended follow up- Urology 1 week and prn  I personally performed the services  described in this documentation, which was scribed in my presence. The recorded information has been reviewed and is accurate.       Justin Blade, MD 08/17/13 [phone]

## 2013-08-17 NOTE — ED Notes (Signed)
Pt had sudden left lower back pain that radiated around side and left side abd. Nausea at times with the "episodes" per pt. Denies urinary changes. nad at this time.

## 2013-08-18 LAB — URINE CULTURE
COLONY COUNT: NO GROWTH
Culture: NO GROWTH

## 2013-09-08 ENCOUNTER — Other Ambulatory Visit (HOSPITAL_COMMUNITY): Payer: Self-pay | Admitting: Pulmonary Disease

## 2013-09-08 DIAGNOSIS — R911 Solitary pulmonary nodule: Secondary | ICD-10-CM

## 2013-09-12 ENCOUNTER — Encounter (HOSPITAL_COMMUNITY): Payer: Self-pay | Admitting: Emergency Medicine

## 2013-09-12 ENCOUNTER — Emergency Department (HOSPITAL_COMMUNITY)
Admission: EM | Admit: 2013-09-12 | Discharge: 2013-09-12 | Disposition: A | Discharge status: Home / Self Care | Payer: 59 | Attending: Emergency Medicine | Admitting: Emergency Medicine

## 2013-09-12 DIAGNOSIS — Z9981 Dependence on supplemental oxygen: Secondary | ICD-10-CM | POA: Insufficient documentation

## 2013-09-12 DIAGNOSIS — N23 Unspecified renal colic: Secondary | ICD-10-CM | POA: Insufficient documentation

## 2013-09-12 DIAGNOSIS — Z9889 Other specified postprocedural states: Secondary | ICD-10-CM | POA: Insufficient documentation

## 2013-09-12 DIAGNOSIS — E785 Hyperlipidemia, unspecified: Secondary | ICD-10-CM | POA: Insufficient documentation

## 2013-09-12 DIAGNOSIS — Z87442 Personal history of urinary calculi: Secondary | ICD-10-CM | POA: Insufficient documentation

## 2013-09-12 DIAGNOSIS — K219 Gastro-esophageal reflux disease without esophagitis: Secondary | ICD-10-CM | POA: Insufficient documentation

## 2013-09-12 DIAGNOSIS — M129 Arthropathy, unspecified: Secondary | ICD-10-CM | POA: Insufficient documentation

## 2013-09-12 DIAGNOSIS — G4730 Sleep apnea, unspecified: Secondary | ICD-10-CM | POA: Insufficient documentation

## 2013-09-12 DIAGNOSIS — Z9089 Acquired absence of other organs: Secondary | ICD-10-CM | POA: Insufficient documentation

## 2013-09-12 DIAGNOSIS — I129 Hypertensive chronic kidney disease with stage 1 through stage 4 chronic kidney disease, or unspecified chronic kidney disease: Secondary | ICD-10-CM | POA: Insufficient documentation

## 2013-09-12 DIAGNOSIS — Z85820 Personal history of malignant melanoma of skin: Secondary | ICD-10-CM | POA: Insufficient documentation

## 2013-09-12 DIAGNOSIS — Z87891 Personal history of nicotine dependence: Secondary | ICD-10-CM | POA: Insufficient documentation

## 2013-09-12 DIAGNOSIS — N189 Chronic kidney disease, unspecified: Secondary | ICD-10-CM | POA: Insufficient documentation

## 2013-09-12 LAB — URINALYSIS, ROUTINE W REFLEX MICROSCOPIC
BILIRUBIN URINE: NEGATIVE
Glucose, UA: NEGATIVE mg/dL
Leukocytes, UA: NEGATIVE
Nitrite: NEGATIVE
Specific Gravity, Urine: 1.025 (ref 1.005–1.030)
UROBILINOGEN UA: 0.2 mg/dL (ref 0.0–1.0)
pH: 5.5 (ref 5.0–8.0)

## 2013-09-12 LAB — URINE MICROSCOPIC-ADD ON

## 2013-09-12 MED ORDER — METOCLOPRAMIDE HCL 10 MG PO TABS: 10.0000 mg | ORAL_TABLET | Freq: Four times a day (QID) | ORAL | Status: DC | PRN

## 2013-09-12 MED ORDER — HYDROMORPHONE HCL PF 2 MG/ML IJ SOLN
2.0000 mg | Freq: Once | INTRAMUSCULAR | Status: AC
  Administered 2013-09-12: 2 mg via INTRAMUSCULAR
  Filled 2013-09-12: qty 1

## 2013-09-12 MED ORDER — CEPHALEXIN 500 MG PO CAPS: ORAL_CAPSULE | ORAL | Status: DC

## 2013-09-12 MED ORDER — KETOROLAC TROMETHAMINE 30 MG/ML IJ SOLN
30.0000 mg | Freq: Once | INTRAMUSCULAR | Status: AC
  Administered 2013-09-12: 30 mg via INTRAMUSCULAR
  Filled 2013-09-12: qty 1

## 2013-09-12 MED ORDER — HYDROMORPHONE HCL 4 MG PO TABS: 4.0000 mg | ORAL_TABLET | ORAL | Status: DC | PRN

## 2013-09-12 MED ORDER — ONDANSETRON 8 MG PO TBDP
8.0000 mg | ORAL_TABLET | Freq: Once | ORAL | Status: AC
  Administered 2013-09-12: 8 mg via ORAL
  Filled 2013-09-12: qty 1

## 2013-09-12 MED ORDER — ONDANSETRON 8 MG PO TBDP: ORAL_TABLET | ORAL | Status: DC

## 2013-09-12 NOTE — ED Notes (Signed)
Dr Stevie Kern with ultrasound at bedside.

## 2013-09-12 NOTE — ED Provider Notes (Signed)
CSN: [id number]     Arrival date & time 09/12/13  0900 History  This chart was scribed for Babette Relic, MD by Girtha Hake, ED Scribe. The patient was seen in APA04/APA04. The patient's care was started at 9:13 AM.     Chief Complaint  Patient presents with  . Flank Pain    Patient is a 63 y.o. male presenting with flank pain. The history is provided by the patient. No language interpreter was used.  Flank Pain  HPI Comments: Justin Montes is a 63 y.o. male with a history of kidney stones who presents to the Emergency Department complaining of sudden onset, severe, colicky left flank pain. He states that the pain is currently a 5/10 but was more severe earlier this morning. He reports associated nausea and two episodes of vomiting. Patient reports that he took Percocet at 3:30am and 7:00am this morning with no relief of pain. Patient experienced similar pain one month ago. He was diagnosed with a kidney stone but he did not pass the stone. Patient denies fever, confusion, testicular pain, SOB, CP, or painful urination.   PCP is Dr. Luan Pulling.   Past Medical History  Diagnosis Date  . Cancer     melona of the right eye  . Sleep apnea     10 on CPAP  . Hypertension   . Hyperlipemia   . Chronic kidney disease     kidney stone age 21  . GERD (gastroesophageal reflux disease)   . Arthritis    Past Surgical History  Procedure Laterality Date  . Eye surgery    . Trigger finger release      right and left thumbs  . Appendectomy      age 22  . Colonoscopy N/A 12/31/2012    Procedure: COLONOSCOPY;  Surgeon: Rogene Houston, MD;  Location: [facility];  Service: Endoscopy;  Laterality: N/A;  730   No family history on file. History  Substance Use Topics  . Smoking status: Former Smoker    Types: Cigarettes    Quit date: 03/11/1973  . Smokeless tobacco: Not on file  . Alcohol Use: No    Review of Systems  Genitourinary: Positive for flank pain.  10 Systems reviewed and  are negative for acute change except as noted in the HPI.     Allergies  Bee venom and Hydrocodone  Home Medications   Prior to Admission medications   Medication Sig Start Date End Date Taking? Authorizing Provider  atorvastatin (LIPITOR) 20 MG tablet Take 20 mg by mouth daily.    Yes Historical Provider, MD  Bevacizumab (AVASTIN IV) Inject into the vein See admin instructions. Gets every 8 weeks   Yes Historical Provider, MD  Coenzyme Q10 (CO Q-10) 200 MG CAPS Take 1 capsule by mouth daily.   Yes Historical Provider, MD  esomeprazole (NEXIUM) 40 MG capsule Take 40 mg by mouth daily before breakfast.     Yes Historical Provider, MD  Glucosamine-Chondroit-Vit C-Mn (GLUCOSAMINE-CHONDROITIN) TABS Take 1 tablet by mouth 2 (two) times daily. Strength: 1500mg /1200mg     Yes Historical Provider, MD  ibuprofen (ADVIL,MOTRIN) 200 MG tablet Take 600 mg by mouth every 6 (six) hours as needed for mild pain.   Yes Historical Provider, MD  Astrid Drafts CAPS Take 1 capsule by mouth daily. Mega Red krill capsules    Yes Historical Provider, MD  losartan (COZAAR) 50 MG tablet Take 50 mg by mouth daily.   Yes Historical Provider, MD  multivitamin (ONE-A-DAY MEN'S) TABS Take 1 tablet by mouth every morning.     Yes Historical Provider, MD  oxyCODONE-acetaminophen (PERCOCET) 5-325 MG per tablet Take 1 tablet by mouth every 4 (four) hours as needed for severe pain. 08/17/13  Yes Richarda Blade, MD  cephALEXin (KEFLEX) 500 MG capsule 2 caps po bid x 7 days 09/12/13   Babette Relic, MD  HYDROmorphone (DILAUDID) 4 MG tablet Take 1 tablet (4 mg total) by mouth every 4 (four) hours as needed for severe pain. 09/12/13   Babette Relic, MD  metoCLOPramide (REGLAN) 10 MG tablet Take 1 tablet (10 mg total) by mouth every 6 (six) hours as needed for nausea (nausea/headache). 09/12/13   Babette Relic, MD  ondansetron (ZOFRAN ODT) 8 MG disintegrating tablet 8mg  ODT q4 hours prn nausea 09/12/13   Babette Relic, MD   Triage Vitals: BP  151/90  Pulse 84  Temp(Src) 97.9 F (36.6 C) (Oral)  Resp 16  Ht 5\' 10"  (1.778 m)  Wt 255 lb (115.667 kg)  BMI 36.59 kg/m2  SpO2 99% Physical Exam  Nursing note and vitals reviewed. Constitutional:  Awake, alert, nontoxic appearance.  HENT:  Head: Atraumatic.  Mouth/Throat: No oropharyngeal exudate.  Eyes: EOM are normal. Pupils are equal, round, and reactive to light. Right eye exhibits no discharge. Left eye exhibits no discharge.  Neck: Neck supple.  Cardiovascular: Normal rate and regular rhythm.   No murmur heard. Pulmonary/Chest: Effort normal and breath sounds normal. No stridor. No respiratory distress. He has no wheezes. He has no rales. He exhibits no tenderness.  Abdominal: Soft. Bowel sounds are normal. He exhibits no mass. There is tenderness (Mild left lower quadrant tenderness.). There is no rebound.  Positive left CVAT.  Genitourinary:  No scrotal tenderness.  Musculoskeletal: He exhibits no tenderness.  Baseline ROM, no obvious new focal weakness.  Lymphadenopathy:    He has no cervical adenopathy.  Neurological:  Mental status and motor strength appears baseline for patient and situation.  Skin: No rash noted.  Psychiatric: He has a normal mood and affect.    ED Course  Procedures (including critical care time) DIAGNOSTIC STUDIES: Oxygen Saturation is 99% on room air, normal by my interpretation.    COORDINATION OF CARE: 1:16 PM - Patient / Family / Caregiver understand and agree with initial ED impression and plan with expectations set for ED visit.  Pt feels improved after observation and/or treatment in ED.  Labs Review Labs Reviewed  URINALYSIS, ROUTINE W REFLEX MICROSCOPIC - Abnormal; Notable for the following:    Hgb urine dipstick LARGE (*)    Ketones, ur TRACE (*)    Protein, ur TRACE (*)    All other components within normal limits  URINE MICROSCOPIC-ADD ON - Abnormal; Notable for the following:    Squamous Epithelial / LPF FEW (*)     All other components within normal limits  URINE CULTURE    Imaging Review No results found.   EKG Interpretation None      MDM   Final diagnoses:  Renal colic on left side    I doubt any other EMC precluding discharge at this time including, but not necessarily limited to the following:sepsis.  This chart was scribed in my presence and reviewed by me personally.   Babette Relic, MD 09/13/13 [phone]

## 2013-09-12 NOTE — Discharge Instructions (Signed)

## 2013-09-12 NOTE — ED Notes (Signed)
Pt c/o left flank pain with n/v that started during the night, was recently seen in er for kidney stones, pt states that the pain feels the same,

## 2013-09-13 LAB — URINE CULTURE

## 2013-09-14 ENCOUNTER — Encounter (HOSPITAL_COMMUNITY): Payer: Self-pay

## 2013-09-14 ENCOUNTER — Ambulatory Visit (HOSPITAL_COMMUNITY)
Admission: RE | Admit: 2013-09-14 | Discharge: 2013-09-14 | Disposition: A | Discharge status: Home / Self Care | Payer: 59 | Source: Ambulatory Visit | Attending: Pulmonary Disease | Admitting: Pulmonary Disease

## 2013-09-14 DIAGNOSIS — E041 Nontoxic single thyroid nodule: Secondary | ICD-10-CM | POA: Insufficient documentation

## 2013-09-14 DIAGNOSIS — R918 Other nonspecific abnormal finding of lung field: Secondary | ICD-10-CM | POA: Insufficient documentation

## 2013-09-14 DIAGNOSIS — R911 Solitary pulmonary nodule: Secondary | ICD-10-CM

## 2013-09-14 DIAGNOSIS — I7090 Unspecified atherosclerosis: Secondary | ICD-10-CM | POA: Insufficient documentation

## 2013-09-14 DIAGNOSIS — Z85820 Personal history of malignant melanoma of skin: Secondary | ICD-10-CM | POA: Insufficient documentation

## 2013-09-14 MED ORDER — IOHEXOL 300 MG/ML  SOLN: 80.0000 mL | Freq: Once | INTRAMUSCULAR | Status: AC | PRN

## 2013-09-14 MED ORDER — IOHEXOL 300 MG/ML  SOLN
80.0000 mL | Freq: Once | INTRAMUSCULAR | Status: AC | PRN
  Administered 2013-09-14: 80 mL via INTRAVENOUS

## 2013-09-21 ENCOUNTER — Other Ambulatory Visit: Payer: Self-pay | Admitting: Urology

## 2013-09-21 ENCOUNTER — Ambulatory Visit (INDEPENDENT_AMBULATORY_CARE_PROVIDER_SITE_OTHER): Payer: 59 | Admitting: Urology

## 2013-09-21 ENCOUNTER — Ambulatory Visit (HOSPITAL_COMMUNITY)
Admission: RE | Admit: 2013-09-21 | Discharge: 2013-09-21 | Disposition: A | Discharge status: Home / Self Care | Payer: 59 | Source: Ambulatory Visit | Attending: Urology | Admitting: Urology

## 2013-09-21 DIAGNOSIS — N201 Calculus of ureter: Secondary | ICD-10-CM | POA: Insufficient documentation

## 2013-10-04 ENCOUNTER — Ambulatory Visit (INDEPENDENT_AMBULATORY_CARE_PROVIDER_SITE_OTHER): Payer: 59 | Admitting: Internal Medicine

## 2013-10-04 ENCOUNTER — Encounter (INDEPENDENT_AMBULATORY_CARE_PROVIDER_SITE_OTHER): Payer: Self-pay | Admitting: *Deleted

## 2013-10-04 ENCOUNTER — Encounter (INDEPENDENT_AMBULATORY_CARE_PROVIDER_SITE_OTHER): Payer: Self-pay | Admitting: Internal Medicine

## 2013-10-04 VITALS — BP 138/72 | HR 80 | Temp 97.4°F | Ht 70.0 in | Wt 246.3 lb

## 2013-10-04 DIAGNOSIS — I10 Essential (primary) hypertension: Secondary | ICD-10-CM | POA: Insufficient documentation

## 2013-10-04 DIAGNOSIS — K760 Fatty (change of) liver, not elsewhere classified: Secondary | ICD-10-CM | POA: Insufficient documentation

## 2013-10-04 DIAGNOSIS — C439 Malignant melanoma of skin, unspecified: Secondary | ICD-10-CM | POA: Insufficient documentation

## 2013-10-04 DIAGNOSIS — E7800 Pure hypercholesterolemia, unspecified: Secondary | ICD-10-CM | POA: Insufficient documentation

## 2013-10-04 DIAGNOSIS — M1990 Unspecified osteoarthritis, unspecified site: Secondary | ICD-10-CM | POA: Insufficient documentation

## 2013-10-04 DIAGNOSIS — K7689 Other specified diseases of liver: Secondary | ICD-10-CM

## 2013-10-04 DIAGNOSIS — K219 Gastro-esophageal reflux disease without esophagitis: Secondary | ICD-10-CM | POA: Insufficient documentation

## 2013-10-04 NOTE — Progress Notes (Signed)
Subjective:     Patient ID: Justin Montes, male   DOB: [birth year], 63 y.o.   MRN: [id number]  HPI Referred to our office by Dr. Luan Pulling for a fatty liver.  Recent US revealed an enlarge liver with diffuse fatty changes.  Appetite is good. No weight loss. No abdominal pain. Usually has a BM at 2 a day. No melena or bright red rectal bleeding.  NO GI problems.  Hx of kidney stones and underwent a CT (see below).   Hx of melanoma of rt eye.    08/17/2013 CT abdomen/pelvis w/o CM: IMPRESSION:  4 mm calculus in the left ureter at the level of L4 causing mild  hydronephrosis and left renal edema. No intrarenal calculi are  identified on either side. There is no hydronephrosis on the right.  The liver is enlarged with diffuse fatty change.  There are small pulmonary basilar nodular lesions as described  above. Followup of these nodular opacity should be based on  Fleischner Society guidelines. If the patient is at high risk for  bronchogenic carcinoma, follow-up chest CT at 1 year is recommended.  If the patient is at low risk, no follow-up is needed. This  recommendation follows the consensus statement: Guidelines for  Management of Small Pulmonary Nodules Detected on CT Scans: A  Statement from the [employer] as published in Radiology  2005; 237:395-400.  No abscess. No bowel obstruction. Appendix absent.  CBC    Component Value Date/Time   WBC 8.4 08/17/2013 0954   RBC 4.96 08/17/2013 0954   HGB 14.7 08/17/2013 0954   HCT 43.2 08/17/2013 0954   PLT 229 08/17/2013 0954   MCV 87.1 08/17/2013 0954   MCH 29.6 08/17/2013 0954   MCHC 34.0 08/17/2013 0954   RDW 12.9 08/17/2013 0954   LYMPHSABS 1.3 08/17/2013 0954   MONOABS 0.5 08/17/2013 0954   EOSABS 0.0 08/17/2013 0954   BASOSABS 0.0 08/17/2013 0954   09/16/2013 AST 46, ALT 46, ALP 71,  Total bili 0.4,    Review of Systems Past Medical History  Diagnosis Date  . Cancer     melona of the right eye  . Sleep apnea     10 on CPAP  .  Hypertension   . Hyperlipemia   . Chronic kidney disease     kidney stone age 63  . GERD (gastroesophageal reflux disease)   . Arthritis     Past Surgical History  Procedure Laterality Date  . Eye surgery    . Trigger finger release      right and left thumbs  . Appendectomy      age 17  . Colonoscopy N/A 12/31/2012    Procedure: COLONOSCOPY;  Surgeon: Rogene Houston, MD;  Location: [facility];  Service: Endoscopy;  Laterality: N/A;  730    Allergies  Allergen Reactions  . Bee Venom Hives and Swelling  . Hydrocodone Nausea Only    Current Outpatient Prescriptions on File Prior to Visit  Medication Sig Dispense Refill  . atorvastatin (LIPITOR) 20 MG tablet Take 20 mg by mouth daily.       . Bevacizumab (AVASTIN IV) Inject into the vein See admin instructions. Gets every 8 weeks      . Coenzyme Q10 (CO Q-10) 200 MG CAPS Take 1 capsule by mouth daily.      Marland Kitchen esomeprazole (NEXIUM) 40 MG capsule Take 40 mg by mouth daily before breakfast.        . Glucosamine-Chondroit-Vit C-Mn (GLUCOSAMINE-CHONDROITIN) TABS  Take 1 tablet by mouth 2 (two) times daily. Strength: 1500mg /1200mg        . ibuprofen (ADVIL,MOTRIN) 200 MG tablet Take 600 mg by mouth every 6 (six) hours as needed for mild pain.      Astrid Drafts CAPS Take 1 capsule by mouth daily. Mega Red krill capsules       . losartan (COZAAR) 50 MG tablet Take 50 mg by mouth daily.      . multivitamin (ONE-A-DAY MEN'S) TABS Take 1 tablet by mouth every morning.         No current facility-administered medications on file prior to visit.        Objective:   Physical Exam  Filed Vitals:   10/04/13 1456  BP: 138/72  Pulse: 80  Temp: 97.4 F (36.3 C)  Height: 5\' 10"  (1.778 m)  Weight: 246 lb 4.8 oz (111.721 kg)  Alert and oriented. Skin warm and dry. Oral mucosa is moist.   . Sclera anicteric, conjunctivae is pink. Thyroid not enlarged. No cervical lymphadenopathy. Lungs clear. Heart regular rate and rhythm.  Abdomen is soft.  Bowel sounds are positive. No hepatomegaly. No abdominal masses felt. No tenderness.  No edema to lower extremities.        Assessment:      Enlarged fatty liver on CT. AST slightly elevated.  Plan:     US abdomen with elastrography. OV 6 months. Hepatic function in 6 months.  Diet and exercise

## 2013-10-04 NOTE — Patient Instructions (Addendum)
OV in 6 months with a Hepatic function. Diet and exercise

## 2013-10-06 ENCOUNTER — Telehealth (INDEPENDENT_AMBULATORY_CARE_PROVIDER_SITE_OTHER): Payer: Self-pay | Admitting: *Deleted

## 2013-10-06 DIAGNOSIS — K760 Fatty (change of) liver, not elsewhere classified: Secondary | ICD-10-CM

## 2013-10-06 NOTE — Telephone Encounter (Signed)
.  Per Justin Montes- Patient is to have labs in 6 months.

## 2013-10-08 ENCOUNTER — Ambulatory Visit (HOSPITAL_COMMUNITY)
Admission: RE | Admit: 2013-10-08 | Discharge: 2013-10-08 | Disposition: A | Discharge status: Home / Self Care | Payer: 59 | Source: Ambulatory Visit | Attending: Internal Medicine | Admitting: Internal Medicine

## 2013-10-08 DIAGNOSIS — K7689 Other specified diseases of liver: Secondary | ICD-10-CM | POA: Diagnosis not present

## 2013-10-08 DIAGNOSIS — K760 Fatty (change of) liver, not elsewhere classified: Secondary | ICD-10-CM

## 2013-10-29 ENCOUNTER — Ambulatory Visit (HOSPITAL_COMMUNITY)
Admission: RE | Admit: 2013-10-29 | Discharge: 2013-10-29 | Disposition: A | Discharge status: Home / Self Care | Payer: 59 | Source: Ambulatory Visit | Attending: Urology | Admitting: Urology

## 2013-10-29 ENCOUNTER — Other Ambulatory Visit: Payer: Self-pay | Admitting: Urology

## 2013-10-29 DIAGNOSIS — N201 Calculus of ureter: Secondary | ICD-10-CM

## 2013-11-02 ENCOUNTER — Ambulatory Visit (INDEPENDENT_AMBULATORY_CARE_PROVIDER_SITE_OTHER): Payer: 59 | Admitting: Urology

## 2013-11-02 DIAGNOSIS — N201 Calculus of ureter: Secondary | ICD-10-CM

## 2014-02-01 ENCOUNTER — Ambulatory Visit (INDEPENDENT_AMBULATORY_CARE_PROVIDER_SITE_OTHER): Payer: 59 | Admitting: Urology

## 2014-02-01 DIAGNOSIS — N201 Calculus of ureter: Secondary | ICD-10-CM

## 2014-02-16 ENCOUNTER — Encounter (INDEPENDENT_AMBULATORY_CARE_PROVIDER_SITE_OTHER): Payer: Self-pay | Admitting: *Deleted

## 2014-03-08 ENCOUNTER — Other Ambulatory Visit (INDEPENDENT_AMBULATORY_CARE_PROVIDER_SITE_OTHER): Payer: Self-pay | Admitting: *Deleted

## 2014-03-08 ENCOUNTER — Encounter (INDEPENDENT_AMBULATORY_CARE_PROVIDER_SITE_OTHER): Payer: Self-pay | Admitting: *Deleted

## 2014-03-08 DIAGNOSIS — K760 Fatty (change of) liver, not elsewhere classified: Secondary | ICD-10-CM

## 2014-03-10 ENCOUNTER — Encounter (INDEPENDENT_AMBULATORY_CARE_PROVIDER_SITE_OTHER): Payer: Self-pay

## 2014-04-06 LAB — HEPATIC FUNCTION PANEL
ALK PHOS: 68 U/L (ref 39–117)
ALT: 39 U/L (ref 0–53)
AST: 34 U/L (ref 0–37)
Albumin: 4.1 g/dL (ref 3.5–5.2)
BILIRUBIN INDIRECT: 0.3 mg/dL (ref 0.2–1.2)
Bilirubin, Direct: 0.1 mg/dL (ref 0.0–0.3)
Total Bilirubin: 0.4 mg/dL (ref 0.2–1.2)
Total Protein: 6.5 g/dL (ref 6.0–8.3)

## 2014-04-07 ENCOUNTER — Other Ambulatory Visit (HOSPITAL_COMMUNITY): Payer: Self-pay | Admitting: Pulmonary Disease

## 2014-04-07 DIAGNOSIS — R911 Solitary pulmonary nodule: Secondary | ICD-10-CM

## 2014-04-12 ENCOUNTER — Ambulatory Visit (HOSPITAL_COMMUNITY)
Admission: RE | Admit: 2014-04-12 | Discharge: 2014-04-12 | Disposition: A | Discharge status: Home / Self Care | Payer: 59 | Source: Ambulatory Visit | Attending: Pulmonary Disease | Admitting: Pulmonary Disease

## 2014-04-12 DIAGNOSIS — R911 Solitary pulmonary nodule: Secondary | ICD-10-CM | POA: Diagnosis not present

## 2014-04-12 DIAGNOSIS — C6990 Malignant neoplasm of unspecified site of unspecified eye: Secondary | ICD-10-CM | POA: Diagnosis not present

## 2014-04-12 LAB — POCT I-STAT CREATININE: Creatinine, Ser: 1.2 mg/dL (ref 0.50–1.35)

## 2014-04-12 MED ORDER — IOHEXOL 300 MG/ML  SOLN
80.0000 mL | Freq: Once | INTRAMUSCULAR | Status: AC | PRN
  Administered 2014-04-12: 80 mL via INTRAVENOUS

## 2014-05-09 ENCOUNTER — Ambulatory Visit (INDEPENDENT_AMBULATORY_CARE_PROVIDER_SITE_OTHER): Payer: 59 | Admitting: Internal Medicine

## 2014-06-21 ENCOUNTER — Ambulatory Visit (INDEPENDENT_AMBULATORY_CARE_PROVIDER_SITE_OTHER): Payer: 59 | Admitting: Internal Medicine

## 2014-12-14 ENCOUNTER — Other Ambulatory Visit (HOSPITAL_COMMUNITY): Payer: Self-pay | Admitting: Pulmonary Disease

## 2014-12-14 ENCOUNTER — Ambulatory Visit (HOSPITAL_COMMUNITY)
Admission: RE | Admit: 2014-12-14 | Discharge: 2014-12-14 | Disposition: A | Discharge status: Home / Self Care | Payer: Commercial Managed Care - HMO | Source: Ambulatory Visit | Attending: Pulmonary Disease | Admitting: Pulmonary Disease

## 2014-12-14 DIAGNOSIS — M9903 Segmental and somatic dysfunction of lumbar region: Secondary | ICD-10-CM | POA: Diagnosis not present

## 2014-12-14 DIAGNOSIS — Z85820 Personal history of malignant melanoma of skin: Secondary | ICD-10-CM | POA: Insufficient documentation

## 2014-12-14 DIAGNOSIS — C4310 Malignant melanoma of unspecified eyelid, including canthus: Secondary | ICD-10-CM

## 2014-12-14 DIAGNOSIS — M5136 Other intervertebral disc degeneration, lumbar region: Secondary | ICD-10-CM | POA: Diagnosis not present

## 2014-12-14 DIAGNOSIS — M9905 Segmental and somatic dysfunction of pelvic region: Secondary | ICD-10-CM | POA: Diagnosis not present

## 2014-12-14 DIAGNOSIS — M545 Low back pain: Secondary | ICD-10-CM | POA: Diagnosis not present

## 2015-01-10 DIAGNOSIS — M7582 Other shoulder lesions, left shoulder: Secondary | ICD-10-CM | POA: Diagnosis not present

## 2015-01-11 DIAGNOSIS — M9905 Segmental and somatic dysfunction of pelvic region: Secondary | ICD-10-CM | POA: Diagnosis not present

## 2015-01-11 DIAGNOSIS — M545 Low back pain: Secondary | ICD-10-CM | POA: Diagnosis not present

## 2015-01-11 DIAGNOSIS — M9903 Segmental and somatic dysfunction of lumbar region: Secondary | ICD-10-CM | POA: Diagnosis not present

## 2015-01-11 DIAGNOSIS — M5136 Other intervertebral disc degeneration, lumbar region: Secondary | ICD-10-CM | POA: Diagnosis not present

## 2015-02-15 DIAGNOSIS — M9905 Segmental and somatic dysfunction of pelvic region: Secondary | ICD-10-CM | POA: Diagnosis not present

## 2015-02-15 DIAGNOSIS — M9903 Segmental and somatic dysfunction of lumbar region: Secondary | ICD-10-CM | POA: Diagnosis not present

## 2015-02-15 DIAGNOSIS — M9902 Segmental and somatic dysfunction of thoracic region: Secondary | ICD-10-CM | POA: Diagnosis not present

## 2015-02-15 DIAGNOSIS — M545 Low back pain: Secondary | ICD-10-CM | POA: Diagnosis not present

## 2015-03-15 DIAGNOSIS — M9903 Segmental and somatic dysfunction of lumbar region: Secondary | ICD-10-CM | POA: Diagnosis not present

## 2015-03-15 DIAGNOSIS — M9905 Segmental and somatic dysfunction of pelvic region: Secondary | ICD-10-CM | POA: Diagnosis not present

## 2015-03-15 DIAGNOSIS — M9902 Segmental and somatic dysfunction of thoracic region: Secondary | ICD-10-CM | POA: Diagnosis not present

## 2015-03-15 DIAGNOSIS — M545 Low back pain: Secondary | ICD-10-CM | POA: Diagnosis not present

## 2015-03-16 DIAGNOSIS — H3589 Other specified retinal disorders: Secondary | ICD-10-CM | POA: Diagnosis not present

## 2015-03-16 DIAGNOSIS — T66XXXD Radiation sickness, unspecified, subsequent encounter: Secondary | ICD-10-CM | POA: Diagnosis not present

## 2015-03-16 DIAGNOSIS — H3500 Unspecified background retinopathy: Secondary | ICD-10-CM | POA: Diagnosis not present

## 2015-03-16 DIAGNOSIS — H3581 Retinal edema: Secondary | ICD-10-CM | POA: Diagnosis not present

## 2015-04-12 DIAGNOSIS — M9905 Segmental and somatic dysfunction of pelvic region: Secondary | ICD-10-CM | POA: Diagnosis not present

## 2015-04-12 DIAGNOSIS — M9903 Segmental and somatic dysfunction of lumbar region: Secondary | ICD-10-CM | POA: Diagnosis not present

## 2015-04-12 DIAGNOSIS — M9902 Segmental and somatic dysfunction of thoracic region: Secondary | ICD-10-CM | POA: Diagnosis not present

## 2015-04-12 DIAGNOSIS — M545 Low back pain: Secondary | ICD-10-CM | POA: Diagnosis not present

## 2015-05-10 DIAGNOSIS — M9903 Segmental and somatic dysfunction of lumbar region: Secondary | ICD-10-CM | POA: Diagnosis not present

## 2015-05-10 DIAGNOSIS — M545 Low back pain: Secondary | ICD-10-CM | POA: Diagnosis not present

## 2015-05-10 DIAGNOSIS — M9902 Segmental and somatic dysfunction of thoracic region: Secondary | ICD-10-CM | POA: Diagnosis not present

## 2015-05-10 DIAGNOSIS — M9905 Segmental and somatic dysfunction of pelvic region: Secondary | ICD-10-CM | POA: Diagnosis not present

## 2015-05-22 DIAGNOSIS — H3589 Other specified retinal disorders: Secondary | ICD-10-CM | POA: Diagnosis not present

## 2015-05-22 DIAGNOSIS — H3581 Retinal edema: Secondary | ICD-10-CM | POA: Diagnosis not present

## 2015-06-14 DIAGNOSIS — M9902 Segmental and somatic dysfunction of thoracic region: Secondary | ICD-10-CM | POA: Diagnosis not present

## 2015-06-14 DIAGNOSIS — M545 Low back pain: Secondary | ICD-10-CM | POA: Diagnosis not present

## 2015-06-14 DIAGNOSIS — M9905 Segmental and somatic dysfunction of pelvic region: Secondary | ICD-10-CM | POA: Diagnosis not present

## 2015-06-14 DIAGNOSIS — M9903 Segmental and somatic dysfunction of lumbar region: Secondary | ICD-10-CM | POA: Diagnosis not present

## 2015-06-21 DIAGNOSIS — M9903 Segmental and somatic dysfunction of lumbar region: Secondary | ICD-10-CM | POA: Diagnosis not present

## 2015-06-21 DIAGNOSIS — M9905 Segmental and somatic dysfunction of pelvic region: Secondary | ICD-10-CM | POA: Diagnosis not present

## 2015-06-21 DIAGNOSIS — M545 Low back pain: Secondary | ICD-10-CM | POA: Diagnosis not present

## 2015-06-21 DIAGNOSIS — M9902 Segmental and somatic dysfunction of thoracic region: Secondary | ICD-10-CM | POA: Diagnosis not present

## 2015-07-12 DIAGNOSIS — M9905 Segmental and somatic dysfunction of pelvic region: Secondary | ICD-10-CM | POA: Diagnosis not present

## 2015-07-12 DIAGNOSIS — M9902 Segmental and somatic dysfunction of thoracic region: Secondary | ICD-10-CM | POA: Diagnosis not present

## 2015-07-12 DIAGNOSIS — M9903 Segmental and somatic dysfunction of lumbar region: Secondary | ICD-10-CM | POA: Diagnosis not present

## 2015-07-12 DIAGNOSIS — M545 Low back pain: Secondary | ICD-10-CM | POA: Diagnosis not present

## 2015-08-02 DIAGNOSIS — T66XXXD Radiation sickness, unspecified, subsequent encounter: Secondary | ICD-10-CM | POA: Diagnosis not present

## 2015-08-02 DIAGNOSIS — H43813 Vitreous degeneration, bilateral: Secondary | ICD-10-CM | POA: Diagnosis not present

## 2015-08-02 DIAGNOSIS — H2513 Age-related nuclear cataract, bilateral: Secondary | ICD-10-CM | POA: Diagnosis not present

## 2015-08-02 DIAGNOSIS — H3589 Other specified retinal disorders: Secondary | ICD-10-CM | POA: Diagnosis not present

## 2015-08-02 DIAGNOSIS — H40059 Ocular hypertension, unspecified eye: Secondary | ICD-10-CM | POA: Diagnosis not present

## 2015-08-02 DIAGNOSIS — H3581 Retinal edema: Secondary | ICD-10-CM | POA: Diagnosis not present

## 2015-08-16 DIAGNOSIS — M545 Low back pain: Secondary | ICD-10-CM | POA: Diagnosis not present

## 2015-08-16 DIAGNOSIS — M9905 Segmental and somatic dysfunction of pelvic region: Secondary | ICD-10-CM | POA: Diagnosis not present

## 2015-08-16 DIAGNOSIS — M9902 Segmental and somatic dysfunction of thoracic region: Secondary | ICD-10-CM | POA: Diagnosis not present

## 2015-08-16 DIAGNOSIS — M9903 Segmental and somatic dysfunction of lumbar region: Secondary | ICD-10-CM | POA: Diagnosis not present

## 2015-08-17 DIAGNOSIS — C6931 Malignant neoplasm of right choroid: Secondary | ICD-10-CM | POA: Diagnosis not present

## 2015-09-13 DIAGNOSIS — M9905 Segmental and somatic dysfunction of pelvic region: Secondary | ICD-10-CM | POA: Diagnosis not present

## 2015-09-13 DIAGNOSIS — M545 Low back pain: Secondary | ICD-10-CM | POA: Diagnosis not present

## 2015-09-13 DIAGNOSIS — M9902 Segmental and somatic dysfunction of thoracic region: Secondary | ICD-10-CM | POA: Diagnosis not present

## 2015-09-13 DIAGNOSIS — M9903 Segmental and somatic dysfunction of lumbar region: Secondary | ICD-10-CM | POA: Diagnosis not present

## 2015-10-11 DIAGNOSIS — M9903 Segmental and somatic dysfunction of lumbar region: Secondary | ICD-10-CM | POA: Diagnosis not present

## 2015-10-11 DIAGNOSIS — M9905 Segmental and somatic dysfunction of pelvic region: Secondary | ICD-10-CM | POA: Diagnosis not present

## 2015-10-11 DIAGNOSIS — M545 Low back pain: Secondary | ICD-10-CM | POA: Diagnosis not present

## 2015-10-11 DIAGNOSIS — M9902 Segmental and somatic dysfunction of thoracic region: Secondary | ICD-10-CM | POA: Diagnosis not present

## 2015-10-11 DIAGNOSIS — M546 Pain in thoracic spine: Secondary | ICD-10-CM | POA: Diagnosis not present

## 2015-10-27 DIAGNOSIS — C6931 Malignant neoplasm of right choroid: Secondary | ICD-10-CM | POA: Diagnosis not present

## 2015-10-27 DIAGNOSIS — H3581 Retinal edema: Secondary | ICD-10-CM | POA: Diagnosis not present

## 2015-10-27 DIAGNOSIS — T66XXXD Radiation sickness, unspecified, subsequent encounter: Secondary | ICD-10-CM | POA: Diagnosis not present

## 2015-10-27 DIAGNOSIS — H3589 Other specified retinal disorders: Secondary | ICD-10-CM | POA: Diagnosis not present

## 2015-10-30 DIAGNOSIS — Z08 Encounter for follow-up examination after completed treatment for malignant neoplasm: Secondary | ICD-10-CM | POA: Diagnosis not present

## 2015-10-30 DIAGNOSIS — Z85828 Personal history of other malignant neoplasm of skin: Secondary | ICD-10-CM | POA: Diagnosis not present

## 2015-10-30 DIAGNOSIS — Z85820 Personal history of malignant melanoma of skin: Secondary | ICD-10-CM | POA: Diagnosis not present

## 2015-10-30 DIAGNOSIS — B078 Other viral warts: Secondary | ICD-10-CM | POA: Diagnosis not present

## 2015-10-30 DIAGNOSIS — Z1283 Encounter for screening for malignant neoplasm of skin: Secondary | ICD-10-CM | POA: Diagnosis not present

## 2015-11-15 DIAGNOSIS — I10 Essential (primary) hypertension: Secondary | ICD-10-CM | POA: Diagnosis not present

## 2015-11-15 DIAGNOSIS — M9903 Segmental and somatic dysfunction of lumbar region: Secondary | ICD-10-CM | POA: Diagnosis not present

## 2015-11-15 DIAGNOSIS — M545 Low back pain: Secondary | ICD-10-CM | POA: Diagnosis not present

## 2015-11-15 DIAGNOSIS — M9905 Segmental and somatic dysfunction of pelvic region: Secondary | ICD-10-CM | POA: Diagnosis not present

## 2015-11-15 DIAGNOSIS — M9902 Segmental and somatic dysfunction of thoracic region: Secondary | ICD-10-CM | POA: Diagnosis not present

## 2015-11-15 DIAGNOSIS — M546 Pain in thoracic spine: Secondary | ICD-10-CM | POA: Diagnosis not present

## 2015-11-15 DIAGNOSIS — M9901 Segmental and somatic dysfunction of cervical region: Secondary | ICD-10-CM | POA: Diagnosis not present

## 2015-12-13 DIAGNOSIS — M546 Pain in thoracic spine: Secondary | ICD-10-CM | POA: Diagnosis not present

## 2015-12-13 DIAGNOSIS — M9903 Segmental and somatic dysfunction of lumbar region: Secondary | ICD-10-CM | POA: Diagnosis not present

## 2015-12-13 DIAGNOSIS — M9902 Segmental and somatic dysfunction of thoracic region: Secondary | ICD-10-CM | POA: Diagnosis not present

## 2015-12-13 DIAGNOSIS — M545 Low back pain: Secondary | ICD-10-CM | POA: Diagnosis not present

## 2015-12-13 DIAGNOSIS — M9905 Segmental and somatic dysfunction of pelvic region: Secondary | ICD-10-CM | POA: Diagnosis not present

## 2016-01-09 DIAGNOSIS — C6930 Malignant neoplasm of unspecified choroid: Secondary | ICD-10-CM | POA: Diagnosis not present

## 2016-01-09 DIAGNOSIS — T66XXXD Radiation sickness, unspecified, subsequent encounter: Secondary | ICD-10-CM | POA: Diagnosis not present

## 2016-01-09 DIAGNOSIS — H3523 Other non-diabetic proliferative retinopathy, bilateral: Secondary | ICD-10-CM | POA: Diagnosis not present

## 2016-01-09 DIAGNOSIS — H43813 Vitreous degeneration, bilateral: Secondary | ICD-10-CM | POA: Diagnosis not present

## 2016-01-10 DIAGNOSIS — M9902 Segmental and somatic dysfunction of thoracic region: Secondary | ICD-10-CM | POA: Diagnosis not present

## 2016-01-10 DIAGNOSIS — M9905 Segmental and somatic dysfunction of pelvic region: Secondary | ICD-10-CM | POA: Diagnosis not present

## 2016-01-10 DIAGNOSIS — M9903 Segmental and somatic dysfunction of lumbar region: Secondary | ICD-10-CM | POA: Diagnosis not present

## 2016-01-10 DIAGNOSIS — M545 Low back pain: Secondary | ICD-10-CM | POA: Diagnosis not present

## 2016-01-10 DIAGNOSIS — M546 Pain in thoracic spine: Secondary | ICD-10-CM | POA: Diagnosis not present

## 2016-01-29 DIAGNOSIS — Z23 Encounter for immunization: Secondary | ICD-10-CM | POA: Diagnosis not present

## 2016-02-14 DIAGNOSIS — M9902 Segmental and somatic dysfunction of thoracic region: Secondary | ICD-10-CM | POA: Diagnosis not present

## 2016-02-14 DIAGNOSIS — M9903 Segmental and somatic dysfunction of lumbar region: Secondary | ICD-10-CM | POA: Diagnosis not present

## 2016-02-14 DIAGNOSIS — M545 Low back pain: Secondary | ICD-10-CM | POA: Diagnosis not present

## 2016-02-14 DIAGNOSIS — M9905 Segmental and somatic dysfunction of pelvic region: Secondary | ICD-10-CM | POA: Diagnosis not present

## 2016-02-14 DIAGNOSIS — M546 Pain in thoracic spine: Secondary | ICD-10-CM | POA: Diagnosis not present

## 2016-03-13 DIAGNOSIS — M9903 Segmental and somatic dysfunction of lumbar region: Secondary | ICD-10-CM | POA: Diagnosis not present

## 2016-03-13 DIAGNOSIS — M545 Low back pain: Secondary | ICD-10-CM | POA: Diagnosis not present

## 2016-03-13 DIAGNOSIS — M9905 Segmental and somatic dysfunction of pelvic region: Secondary | ICD-10-CM | POA: Diagnosis not present

## 2016-03-13 DIAGNOSIS — M9902 Segmental and somatic dysfunction of thoracic region: Secondary | ICD-10-CM | POA: Diagnosis not present

## 2016-03-13 DIAGNOSIS — M546 Pain in thoracic spine: Secondary | ICD-10-CM | POA: Diagnosis not present

## 2016-03-19 DIAGNOSIS — H43813 Vitreous degeneration, bilateral: Secondary | ICD-10-CM | POA: Diagnosis not present

## 2016-03-19 DIAGNOSIS — C6931 Malignant neoplasm of right choroid: Secondary | ICD-10-CM | POA: Diagnosis not present

## 2016-03-19 DIAGNOSIS — T66XXXD Radiation sickness, unspecified, subsequent encounter: Secondary | ICD-10-CM | POA: Diagnosis not present

## 2016-03-19 DIAGNOSIS — H3500 Unspecified background retinopathy: Secondary | ICD-10-CM | POA: Diagnosis not present

## 2016-04-17 DIAGNOSIS — M545 Low back pain: Secondary | ICD-10-CM | POA: Diagnosis not present

## 2016-04-17 DIAGNOSIS — M9903 Segmental and somatic dysfunction of lumbar region: Secondary | ICD-10-CM | POA: Diagnosis not present

## 2016-04-17 DIAGNOSIS — M9905 Segmental and somatic dysfunction of pelvic region: Secondary | ICD-10-CM | POA: Diagnosis not present

## 2016-04-17 DIAGNOSIS — M546 Pain in thoracic spine: Secondary | ICD-10-CM | POA: Diagnosis not present

## 2016-04-17 DIAGNOSIS — M9902 Segmental and somatic dysfunction of thoracic region: Secondary | ICD-10-CM | POA: Diagnosis not present

## 2016-05-22 DIAGNOSIS — M9902 Segmental and somatic dysfunction of thoracic region: Secondary | ICD-10-CM | POA: Diagnosis not present

## 2016-05-22 DIAGNOSIS — M9905 Segmental and somatic dysfunction of pelvic region: Secondary | ICD-10-CM | POA: Diagnosis not present

## 2016-05-22 DIAGNOSIS — M9903 Segmental and somatic dysfunction of lumbar region: Secondary | ICD-10-CM | POA: Diagnosis not present

## 2016-05-22 DIAGNOSIS — M546 Pain in thoracic spine: Secondary | ICD-10-CM | POA: Diagnosis not present

## 2016-05-22 DIAGNOSIS — M545 Low back pain: Secondary | ICD-10-CM | POA: Diagnosis not present

## 2016-06-03 DIAGNOSIS — H35351 Cystoid macular degeneration, right eye: Secondary | ICD-10-CM | POA: Diagnosis not present

## 2016-07-04 DIAGNOSIS — H40053 Ocular hypertension, bilateral: Secondary | ICD-10-CM | POA: Diagnosis not present

## 2016-07-10 DIAGNOSIS — M546 Pain in thoracic spine: Secondary | ICD-10-CM | POA: Diagnosis not present

## 2016-07-10 DIAGNOSIS — M9903 Segmental and somatic dysfunction of lumbar region: Secondary | ICD-10-CM | POA: Diagnosis not present

## 2016-07-10 DIAGNOSIS — M545 Low back pain: Secondary | ICD-10-CM | POA: Diagnosis not present

## 2016-07-10 DIAGNOSIS — M9905 Segmental and somatic dysfunction of pelvic region: Secondary | ICD-10-CM | POA: Diagnosis not present

## 2016-07-10 DIAGNOSIS — M9901 Segmental and somatic dysfunction of cervical region: Secondary | ICD-10-CM | POA: Diagnosis not present

## 2016-07-10 DIAGNOSIS — M9902 Segmental and somatic dysfunction of thoracic region: Secondary | ICD-10-CM | POA: Diagnosis not present

## 2016-08-08 DIAGNOSIS — H401111 Primary open-angle glaucoma, right eye, mild stage: Secondary | ICD-10-CM | POA: Diagnosis not present

## 2016-08-19 DIAGNOSIS — H35351 Cystoid macular degeneration, right eye: Secondary | ICD-10-CM | POA: Diagnosis not present

## 2016-09-05 ENCOUNTER — Ambulatory Visit (HOSPITAL_COMMUNITY)
Admission: RE | Admit: 2016-09-05 | Discharge: 2016-09-05 | Disposition: A | Discharge status: Home / Self Care | Payer: Medicare Other | Source: Ambulatory Visit | Attending: Pulmonary Disease | Admitting: Pulmonary Disease

## 2016-09-05 ENCOUNTER — Other Ambulatory Visit (HOSPITAL_COMMUNITY): Payer: Self-pay | Admitting: Pulmonary Disease

## 2016-09-05 DIAGNOSIS — I700 Atherosclerosis of aorta: Secondary | ICD-10-CM | POA: Insufficient documentation

## 2016-09-05 DIAGNOSIS — Z0000 Encounter for general adult medical examination without abnormal findings: Secondary | ICD-10-CM | POA: Diagnosis not present

## 2016-09-05 DIAGNOSIS — Z125 Encounter for screening for malignant neoplasm of prostate: Secondary | ICD-10-CM | POA: Diagnosis not present

## 2016-09-05 DIAGNOSIS — E669 Obesity, unspecified: Secondary | ICD-10-CM | POA: Diagnosis not present

## 2016-09-05 DIAGNOSIS — J45909 Unspecified asthma, uncomplicated: Secondary | ICD-10-CM | POA: Diagnosis not present

## 2016-09-05 DIAGNOSIS — J849 Interstitial pulmonary disease, unspecified: Secondary | ICD-10-CM | POA: Diagnosis not present

## 2016-09-05 DIAGNOSIS — G4733 Obstructive sleep apnea (adult) (pediatric): Secondary | ICD-10-CM | POA: Diagnosis not present

## 2016-09-05 DIAGNOSIS — C6990 Malignant neoplasm of unspecified site of unspecified eye: Secondary | ICD-10-CM | POA: Diagnosis not present

## 2016-09-05 DIAGNOSIS — C6991 Malignant neoplasm of unspecified site of right eye: Secondary | ICD-10-CM | POA: Insufficient documentation

## 2016-09-05 DIAGNOSIS — Z8249 Family history of ischemic heart disease and other diseases of the circulatory system: Secondary | ICD-10-CM | POA: Diagnosis not present

## 2016-09-05 DIAGNOSIS — K219 Gastro-esophageal reflux disease without esophagitis: Secondary | ICD-10-CM | POA: Diagnosis not present

## 2016-09-05 DIAGNOSIS — E785 Hyperlipidemia, unspecified: Secondary | ICD-10-CM | POA: Diagnosis not present

## 2016-09-05 DIAGNOSIS — I10 Essential (primary) hypertension: Secondary | ICD-10-CM | POA: Diagnosis not present

## 2016-09-18 DIAGNOSIS — M546 Pain in thoracic spine: Secondary | ICD-10-CM | POA: Diagnosis not present

## 2016-09-18 DIAGNOSIS — M9901 Segmental and somatic dysfunction of cervical region: Secondary | ICD-10-CM | POA: Diagnosis not present

## 2016-09-18 DIAGNOSIS — I10 Essential (primary) hypertension: Secondary | ICD-10-CM | POA: Diagnosis not present

## 2016-09-18 DIAGNOSIS — M9905 Segmental and somatic dysfunction of pelvic region: Secondary | ICD-10-CM | POA: Diagnosis not present

## 2016-09-18 DIAGNOSIS — M9903 Segmental and somatic dysfunction of lumbar region: Secondary | ICD-10-CM | POA: Diagnosis not present

## 2016-09-18 DIAGNOSIS — M545 Low back pain: Secondary | ICD-10-CM | POA: Diagnosis not present

## 2016-09-18 DIAGNOSIS — M9902 Segmental and somatic dysfunction of thoracic region: Secondary | ICD-10-CM | POA: Diagnosis not present

## 2016-10-03 DIAGNOSIS — E785 Hyperlipidemia, unspecified: Secondary | ICD-10-CM | POA: Diagnosis not present

## 2016-10-03 DIAGNOSIS — G4733 Obstructive sleep apnea (adult) (pediatric): Secondary | ICD-10-CM | POA: Diagnosis not present

## 2016-10-03 DIAGNOSIS — R208 Other disturbances of skin sensation: Secondary | ICD-10-CM | POA: Diagnosis not present

## 2016-10-03 DIAGNOSIS — G5622 Lesion of ulnar nerve, left upper limb: Secondary | ICD-10-CM | POA: Diagnosis not present

## 2016-10-03 DIAGNOSIS — I10 Essential (primary) hypertension: Secondary | ICD-10-CM | POA: Diagnosis not present

## 2016-10-09 DIAGNOSIS — M9901 Segmental and somatic dysfunction of cervical region: Secondary | ICD-10-CM | POA: Diagnosis not present

## 2016-10-09 DIAGNOSIS — M9902 Segmental and somatic dysfunction of thoracic region: Secondary | ICD-10-CM | POA: Diagnosis not present

## 2016-10-09 DIAGNOSIS — M545 Low back pain: Secondary | ICD-10-CM | POA: Diagnosis not present

## 2016-10-09 DIAGNOSIS — M9903 Segmental and somatic dysfunction of lumbar region: Secondary | ICD-10-CM | POA: Diagnosis not present

## 2016-10-09 DIAGNOSIS — M9905 Segmental and somatic dysfunction of pelvic region: Secondary | ICD-10-CM | POA: Diagnosis not present

## 2016-10-09 DIAGNOSIS — M546 Pain in thoracic spine: Secondary | ICD-10-CM | POA: Diagnosis not present

## 2016-10-24 DIAGNOSIS — E785 Hyperlipidemia, unspecified: Secondary | ICD-10-CM | POA: Diagnosis not present

## 2016-10-24 DIAGNOSIS — G5601 Carpal tunnel syndrome, right upper limb: Secondary | ICD-10-CM | POA: Diagnosis not present

## 2016-10-24 DIAGNOSIS — M5412 Radiculopathy, cervical region: Secondary | ICD-10-CM | POA: Diagnosis not present

## 2016-10-24 DIAGNOSIS — R208 Other disturbances of skin sensation: Secondary | ICD-10-CM | POA: Diagnosis not present

## 2016-10-24 DIAGNOSIS — G5622 Lesion of ulnar nerve, left upper limb: Secondary | ICD-10-CM | POA: Diagnosis not present

## 2016-10-24 DIAGNOSIS — G4733 Obstructive sleep apnea (adult) (pediatric): Secondary | ICD-10-CM | POA: Diagnosis not present

## 2016-10-24 DIAGNOSIS — I10 Essential (primary) hypertension: Secondary | ICD-10-CM | POA: Diagnosis not present

## 2016-11-04 DIAGNOSIS — H35351 Cystoid macular degeneration, right eye: Secondary | ICD-10-CM | POA: Diagnosis not present

## 2016-11-13 DIAGNOSIS — M545 Low back pain: Secondary | ICD-10-CM | POA: Diagnosis not present

## 2016-11-13 DIAGNOSIS — M546 Pain in thoracic spine: Secondary | ICD-10-CM | POA: Diagnosis not present

## 2016-11-13 DIAGNOSIS — M9903 Segmental and somatic dysfunction of lumbar region: Secondary | ICD-10-CM | POA: Diagnosis not present

## 2016-11-13 DIAGNOSIS — M9905 Segmental and somatic dysfunction of pelvic region: Secondary | ICD-10-CM | POA: Diagnosis not present

## 2016-11-13 DIAGNOSIS — M9901 Segmental and somatic dysfunction of cervical region: Secondary | ICD-10-CM | POA: Diagnosis not present

## 2016-11-13 DIAGNOSIS — M9902 Segmental and somatic dysfunction of thoracic region: Secondary | ICD-10-CM | POA: Diagnosis not present

## 2016-11-25 DIAGNOSIS — Z85820 Personal history of malignant melanoma of skin: Secondary | ICD-10-CM | POA: Diagnosis not present

## 2016-11-25 DIAGNOSIS — D225 Melanocytic nevi of trunk: Secondary | ICD-10-CM | POA: Diagnosis not present

## 2016-11-25 DIAGNOSIS — L570 Actinic keratosis: Secondary | ICD-10-CM | POA: Diagnosis not present

## 2016-11-25 DIAGNOSIS — Z1283 Encounter for screening for malignant neoplasm of skin: Secondary | ICD-10-CM | POA: Diagnosis not present

## 2016-11-25 DIAGNOSIS — X32XXXD Exposure to sunlight, subsequent encounter: Secondary | ICD-10-CM | POA: Diagnosis not present

## 2016-11-25 DIAGNOSIS — L859 Epidermal thickening, unspecified: Secondary | ICD-10-CM | POA: Diagnosis not present

## 2016-11-25 DIAGNOSIS — Z08 Encounter for follow-up examination after completed treatment for malignant neoplasm: Secondary | ICD-10-CM | POA: Diagnosis not present

## 2016-11-25 DIAGNOSIS — D485 Neoplasm of uncertain behavior of skin: Secondary | ICD-10-CM | POA: Diagnosis not present

## 2016-11-27 DIAGNOSIS — Z6836 Body mass index (BMI) 36.0-36.9, adult: Secondary | ICD-10-CM | POA: Diagnosis not present

## 2016-11-27 DIAGNOSIS — I10 Essential (primary) hypertension: Secondary | ICD-10-CM | POA: Diagnosis not present

## 2016-11-27 DIAGNOSIS — G5622 Lesion of ulnar nerve, left upper limb: Secondary | ICD-10-CM | POA: Diagnosis not present

## 2016-12-11 DIAGNOSIS — I10 Essential (primary) hypertension: Secondary | ICD-10-CM | POA: Diagnosis not present

## 2016-12-11 DIAGNOSIS — M9901 Segmental and somatic dysfunction of cervical region: Secondary | ICD-10-CM | POA: Diagnosis not present

## 2016-12-11 DIAGNOSIS — M9902 Segmental and somatic dysfunction of thoracic region: Secondary | ICD-10-CM | POA: Diagnosis not present

## 2016-12-11 DIAGNOSIS — M9903 Segmental and somatic dysfunction of lumbar region: Secondary | ICD-10-CM | POA: Diagnosis not present

## 2016-12-11 DIAGNOSIS — M9905 Segmental and somatic dysfunction of pelvic region: Secondary | ICD-10-CM | POA: Diagnosis not present

## 2016-12-11 DIAGNOSIS — M546 Pain in thoracic spine: Secondary | ICD-10-CM | POA: Diagnosis not present

## 2016-12-11 DIAGNOSIS — M545 Low back pain: Secondary | ICD-10-CM | POA: Diagnosis not present

## 2016-12-12 DIAGNOSIS — M9905 Segmental and somatic dysfunction of pelvic region: Secondary | ICD-10-CM | POA: Diagnosis not present

## 2016-12-12 DIAGNOSIS — M546 Pain in thoracic spine: Secondary | ICD-10-CM | POA: Diagnosis not present

## 2016-12-12 DIAGNOSIS — M9903 Segmental and somatic dysfunction of lumbar region: Secondary | ICD-10-CM | POA: Diagnosis not present

## 2016-12-12 DIAGNOSIS — M545 Low back pain: Secondary | ICD-10-CM | POA: Diagnosis not present

## 2016-12-12 DIAGNOSIS — I10 Essential (primary) hypertension: Secondary | ICD-10-CM | POA: Diagnosis not present

## 2016-12-12 DIAGNOSIS — M9902 Segmental and somatic dysfunction of thoracic region: Secondary | ICD-10-CM | POA: Diagnosis not present

## 2016-12-12 DIAGNOSIS — M9901 Segmental and somatic dysfunction of cervical region: Secondary | ICD-10-CM | POA: Diagnosis not present

## 2017-01-15 DIAGNOSIS — M546 Pain in thoracic spine: Secondary | ICD-10-CM | POA: Diagnosis not present

## 2017-01-15 DIAGNOSIS — M9902 Segmental and somatic dysfunction of thoracic region: Secondary | ICD-10-CM | POA: Diagnosis not present

## 2017-01-15 DIAGNOSIS — M545 Low back pain: Secondary | ICD-10-CM | POA: Diagnosis not present

## 2017-01-15 DIAGNOSIS — M9903 Segmental and somatic dysfunction of lumbar region: Secondary | ICD-10-CM | POA: Diagnosis not present

## 2017-01-15 DIAGNOSIS — M9901 Segmental and somatic dysfunction of cervical region: Secondary | ICD-10-CM | POA: Diagnosis not present

## 2017-01-15 DIAGNOSIS — M9905 Segmental and somatic dysfunction of pelvic region: Secondary | ICD-10-CM | POA: Diagnosis not present

## 2017-01-20 DIAGNOSIS — H35351 Cystoid macular degeneration, right eye: Secondary | ICD-10-CM | POA: Diagnosis not present

## 2017-01-22 DIAGNOSIS — Z23 Encounter for immunization: Secondary | ICD-10-CM | POA: Diagnosis not present

## 2017-02-04 DIAGNOSIS — H401111 Primary open-angle glaucoma, right eye, mild stage: Secondary | ICD-10-CM | POA: Diagnosis not present

## 2017-02-10 DIAGNOSIS — M546 Pain in thoracic spine: Secondary | ICD-10-CM | POA: Diagnosis not present

## 2017-02-10 DIAGNOSIS — M9903 Segmental and somatic dysfunction of lumbar region: Secondary | ICD-10-CM | POA: Diagnosis not present

## 2017-02-10 DIAGNOSIS — M9902 Segmental and somatic dysfunction of thoracic region: Secondary | ICD-10-CM | POA: Diagnosis not present

## 2017-02-10 DIAGNOSIS — M545 Low back pain: Secondary | ICD-10-CM | POA: Diagnosis not present

## 2017-02-10 DIAGNOSIS — M9901 Segmental and somatic dysfunction of cervical region: Secondary | ICD-10-CM | POA: Diagnosis not present

## 2017-02-10 DIAGNOSIS — M9905 Segmental and somatic dysfunction of pelvic region: Secondary | ICD-10-CM | POA: Diagnosis not present

## 2017-02-14 DIAGNOSIS — I10 Essential (primary) hypertension: Secondary | ICD-10-CM | POA: Diagnosis not present

## 2017-02-14 DIAGNOSIS — C6990 Malignant neoplasm of unspecified site of unspecified eye: Secondary | ICD-10-CM | POA: Diagnosis not present

## 2017-02-14 DIAGNOSIS — K219 Gastro-esophageal reflux disease without esophagitis: Secondary | ICD-10-CM | POA: Diagnosis not present

## 2017-02-14 DIAGNOSIS — G4733 Obstructive sleep apnea (adult) (pediatric): Secondary | ICD-10-CM | POA: Diagnosis not present

## 2017-02-14 DIAGNOSIS — E669 Obesity, unspecified: Secondary | ICD-10-CM | POA: Diagnosis not present

## 2017-02-14 DIAGNOSIS — R739 Hyperglycemia, unspecified: Secondary | ICD-10-CM | POA: Diagnosis not present

## 2017-02-14 DIAGNOSIS — J45909 Unspecified asthma, uncomplicated: Secondary | ICD-10-CM | POA: Diagnosis not present

## 2017-02-14 DIAGNOSIS — E785 Hyperlipidemia, unspecified: Secondary | ICD-10-CM | POA: Diagnosis not present

## 2017-02-20 DIAGNOSIS — K210 Gastro-esophageal reflux disease with esophagitis: Secondary | ICD-10-CM | POA: Diagnosis not present

## 2017-02-20 DIAGNOSIS — E669 Obesity, unspecified: Secondary | ICD-10-CM | POA: Diagnosis not present

## 2017-02-20 DIAGNOSIS — I10 Essential (primary) hypertension: Secondary | ICD-10-CM | POA: Diagnosis not present

## 2017-02-20 DIAGNOSIS — C6990 Malignant neoplasm of unspecified site of unspecified eye: Secondary | ICD-10-CM | POA: Diagnosis not present

## 2017-04-08 DIAGNOSIS — H35351 Cystoid macular degeneration, right eye: Secondary | ICD-10-CM | POA: Diagnosis not present

## 2017-05-30 DIAGNOSIS — M9905 Segmental and somatic dysfunction of pelvic region: Secondary | ICD-10-CM | POA: Diagnosis not present

## 2017-05-30 DIAGNOSIS — M545 Low back pain: Secondary | ICD-10-CM | POA: Diagnosis not present

## 2017-05-30 DIAGNOSIS — M546 Pain in thoracic spine: Secondary | ICD-10-CM | POA: Diagnosis not present

## 2017-05-30 DIAGNOSIS — M9902 Segmental and somatic dysfunction of thoracic region: Secondary | ICD-10-CM | POA: Diagnosis not present

## 2017-05-30 DIAGNOSIS — M9903 Segmental and somatic dysfunction of lumbar region: Secondary | ICD-10-CM | POA: Diagnosis not present

## 2017-05-30 DIAGNOSIS — I10 Essential (primary) hypertension: Secondary | ICD-10-CM | POA: Diagnosis not present

## 2017-05-30 DIAGNOSIS — M9901 Segmental and somatic dysfunction of cervical region: Secondary | ICD-10-CM | POA: Diagnosis not present

## 2017-06-02 DIAGNOSIS — X32XXXD Exposure to sunlight, subsequent encounter: Secondary | ICD-10-CM | POA: Diagnosis not present

## 2017-06-02 DIAGNOSIS — L570 Actinic keratosis: Secondary | ICD-10-CM | POA: Diagnosis not present

## 2017-06-02 DIAGNOSIS — Z85820 Personal history of malignant melanoma of skin: Secondary | ICD-10-CM | POA: Diagnosis not present

## 2017-06-02 DIAGNOSIS — Z08 Encounter for follow-up examination after completed treatment for malignant neoplasm: Secondary | ICD-10-CM | POA: Diagnosis not present

## 2017-06-02 DIAGNOSIS — Z1283 Encounter for screening for malignant neoplasm of skin: Secondary | ICD-10-CM | POA: Diagnosis not present

## 2017-06-02 DIAGNOSIS — D225 Melanocytic nevi of trunk: Secondary | ICD-10-CM | POA: Diagnosis not present

## 2017-07-01 DIAGNOSIS — C6931 Malignant neoplasm of right choroid: Secondary | ICD-10-CM | POA: Diagnosis not present

## 2017-07-01 DIAGNOSIS — H35351 Cystoid macular degeneration, right eye: Secondary | ICD-10-CM | POA: Diagnosis not present

## 2017-07-29 DIAGNOSIS — H401111 Primary open-angle glaucoma, right eye, mild stage: Secondary | ICD-10-CM | POA: Diagnosis not present

## 2017-08-05 DIAGNOSIS — H401111 Primary open-angle glaucoma, right eye, mild stage: Secondary | ICD-10-CM | POA: Diagnosis not present

## 2017-08-13 DIAGNOSIS — M545 Low back pain: Secondary | ICD-10-CM | POA: Diagnosis not present

## 2017-08-13 DIAGNOSIS — M9903 Segmental and somatic dysfunction of lumbar region: Secondary | ICD-10-CM | POA: Diagnosis not present

## 2017-08-13 DIAGNOSIS — M9905 Segmental and somatic dysfunction of pelvic region: Secondary | ICD-10-CM | POA: Diagnosis not present

## 2017-08-13 DIAGNOSIS — M9902 Segmental and somatic dysfunction of thoracic region: Secondary | ICD-10-CM | POA: Diagnosis not present

## 2017-08-13 DIAGNOSIS — M9901 Segmental and somatic dysfunction of cervical region: Secondary | ICD-10-CM | POA: Diagnosis not present

## 2017-08-13 DIAGNOSIS — M546 Pain in thoracic spine: Secondary | ICD-10-CM | POA: Diagnosis not present

## 2017-08-27 DIAGNOSIS — M546 Pain in thoracic spine: Secondary | ICD-10-CM | POA: Diagnosis not present

## 2017-08-27 DIAGNOSIS — M9901 Segmental and somatic dysfunction of cervical region: Secondary | ICD-10-CM | POA: Diagnosis not present

## 2017-08-27 DIAGNOSIS — I10 Essential (primary) hypertension: Secondary | ICD-10-CM | POA: Diagnosis not present

## 2017-08-27 DIAGNOSIS — M9903 Segmental and somatic dysfunction of lumbar region: Secondary | ICD-10-CM | POA: Diagnosis not present

## 2017-08-27 DIAGNOSIS — M9905 Segmental and somatic dysfunction of pelvic region: Secondary | ICD-10-CM | POA: Diagnosis not present

## 2017-08-27 DIAGNOSIS — M9902 Segmental and somatic dysfunction of thoracic region: Secondary | ICD-10-CM | POA: Diagnosis not present

## 2017-08-27 DIAGNOSIS — M545 Low back pain: Secondary | ICD-10-CM | POA: Diagnosis not present

## 2017-09-22 DIAGNOSIS — H35351 Cystoid macular degeneration, right eye: Secondary | ICD-10-CM | POA: Diagnosis not present

## 2017-10-13 DIAGNOSIS — H401111 Primary open-angle glaucoma, right eye, mild stage: Secondary | ICD-10-CM | POA: Diagnosis not present

## 2017-10-15 DIAGNOSIS — M545 Low back pain: Secondary | ICD-10-CM | POA: Diagnosis not present

## 2017-10-15 DIAGNOSIS — M9902 Segmental and somatic dysfunction of thoracic region: Secondary | ICD-10-CM | POA: Diagnosis not present

## 2017-10-15 DIAGNOSIS — M9901 Segmental and somatic dysfunction of cervical region: Secondary | ICD-10-CM | POA: Diagnosis not present

## 2017-10-15 DIAGNOSIS — M9903 Segmental and somatic dysfunction of lumbar region: Secondary | ICD-10-CM | POA: Diagnosis not present

## 2017-10-15 DIAGNOSIS — M546 Pain in thoracic spine: Secondary | ICD-10-CM | POA: Diagnosis not present

## 2017-10-15 DIAGNOSIS — M9905 Segmental and somatic dysfunction of pelvic region: Secondary | ICD-10-CM | POA: Diagnosis not present

## 2017-10-17 DIAGNOSIS — C6931 Malignant neoplasm of right choroid: Secondary | ICD-10-CM | POA: Diagnosis not present

## 2017-12-10 DIAGNOSIS — M9902 Segmental and somatic dysfunction of thoracic region: Secondary | ICD-10-CM | POA: Diagnosis not present

## 2017-12-10 DIAGNOSIS — M9905 Segmental and somatic dysfunction of pelvic region: Secondary | ICD-10-CM | POA: Diagnosis not present

## 2017-12-10 DIAGNOSIS — M545 Low back pain: Secondary | ICD-10-CM | POA: Diagnosis not present

## 2017-12-10 DIAGNOSIS — M546 Pain in thoracic spine: Secondary | ICD-10-CM | POA: Diagnosis not present

## 2017-12-10 DIAGNOSIS — I10 Essential (primary) hypertension: Secondary | ICD-10-CM | POA: Diagnosis not present

## 2017-12-10 DIAGNOSIS — M9903 Segmental and somatic dysfunction of lumbar region: Secondary | ICD-10-CM | POA: Diagnosis not present

## 2017-12-17 DIAGNOSIS — M9902 Segmental and somatic dysfunction of thoracic region: Secondary | ICD-10-CM | POA: Diagnosis not present

## 2017-12-17 DIAGNOSIS — M546 Pain in thoracic spine: Secondary | ICD-10-CM | POA: Diagnosis not present

## 2017-12-17 DIAGNOSIS — M545 Low back pain: Secondary | ICD-10-CM | POA: Diagnosis not present

## 2017-12-17 DIAGNOSIS — M9903 Segmental and somatic dysfunction of lumbar region: Secondary | ICD-10-CM | POA: Diagnosis not present

## 2017-12-17 DIAGNOSIS — M9905 Segmental and somatic dysfunction of pelvic region: Secondary | ICD-10-CM | POA: Diagnosis not present

## 2017-12-23 DIAGNOSIS — H35351 Cystoid macular degeneration, right eye: Secondary | ICD-10-CM | POA: Diagnosis not present

## 2017-12-24 DIAGNOSIS — M9905 Segmental and somatic dysfunction of pelvic region: Secondary | ICD-10-CM | POA: Diagnosis not present

## 2017-12-24 DIAGNOSIS — M546 Pain in thoracic spine: Secondary | ICD-10-CM | POA: Diagnosis not present

## 2017-12-24 DIAGNOSIS — M9902 Segmental and somatic dysfunction of thoracic region: Secondary | ICD-10-CM | POA: Diagnosis not present

## 2017-12-24 DIAGNOSIS — M545 Low back pain: Secondary | ICD-10-CM | POA: Diagnosis not present

## 2017-12-24 DIAGNOSIS — M9903 Segmental and somatic dysfunction of lumbar region: Secondary | ICD-10-CM | POA: Diagnosis not present

## 2018-01-08 DIAGNOSIS — Z23 Encounter for immunization: Secondary | ICD-10-CM | POA: Diagnosis not present

## 2018-01-12 DIAGNOSIS — M545 Low back pain: Secondary | ICD-10-CM | POA: Diagnosis not present

## 2018-01-12 DIAGNOSIS — M9902 Segmental and somatic dysfunction of thoracic region: Secondary | ICD-10-CM | POA: Diagnosis not present

## 2018-01-12 DIAGNOSIS — M9905 Segmental and somatic dysfunction of pelvic region: Secondary | ICD-10-CM | POA: Diagnosis not present

## 2018-01-12 DIAGNOSIS — M9903 Segmental and somatic dysfunction of lumbar region: Secondary | ICD-10-CM | POA: Diagnosis not present

## 2018-01-12 DIAGNOSIS — M546 Pain in thoracic spine: Secondary | ICD-10-CM | POA: Diagnosis not present

## 2018-02-09 DIAGNOSIS — M9903 Segmental and somatic dysfunction of lumbar region: Secondary | ICD-10-CM | POA: Diagnosis not present

## 2018-02-09 DIAGNOSIS — M9902 Segmental and somatic dysfunction of thoracic region: Secondary | ICD-10-CM | POA: Diagnosis not present

## 2018-02-09 DIAGNOSIS — M545 Low back pain: Secondary | ICD-10-CM | POA: Diagnosis not present

## 2018-02-09 DIAGNOSIS — M9905 Segmental and somatic dysfunction of pelvic region: Secondary | ICD-10-CM | POA: Diagnosis not present

## 2018-02-09 DIAGNOSIS — M546 Pain in thoracic spine: Secondary | ICD-10-CM | POA: Diagnosis not present

## 2018-02-10 DIAGNOSIS — H401111 Primary open-angle glaucoma, right eye, mild stage: Secondary | ICD-10-CM | POA: Diagnosis not present

## 2018-02-17 IMAGING — DX DG CHEST 2V
2 series · 2 of 2 positions shown · non-contrast
Comparison: Chest x-ray of December 14, 2014

CLINICAL DATA: Malignant melanoma of the right eye. Follow-up chest
x-ray. History of hypertension. Former smoker.

EXAM:
CHEST  2 VIEW

[chest pa]
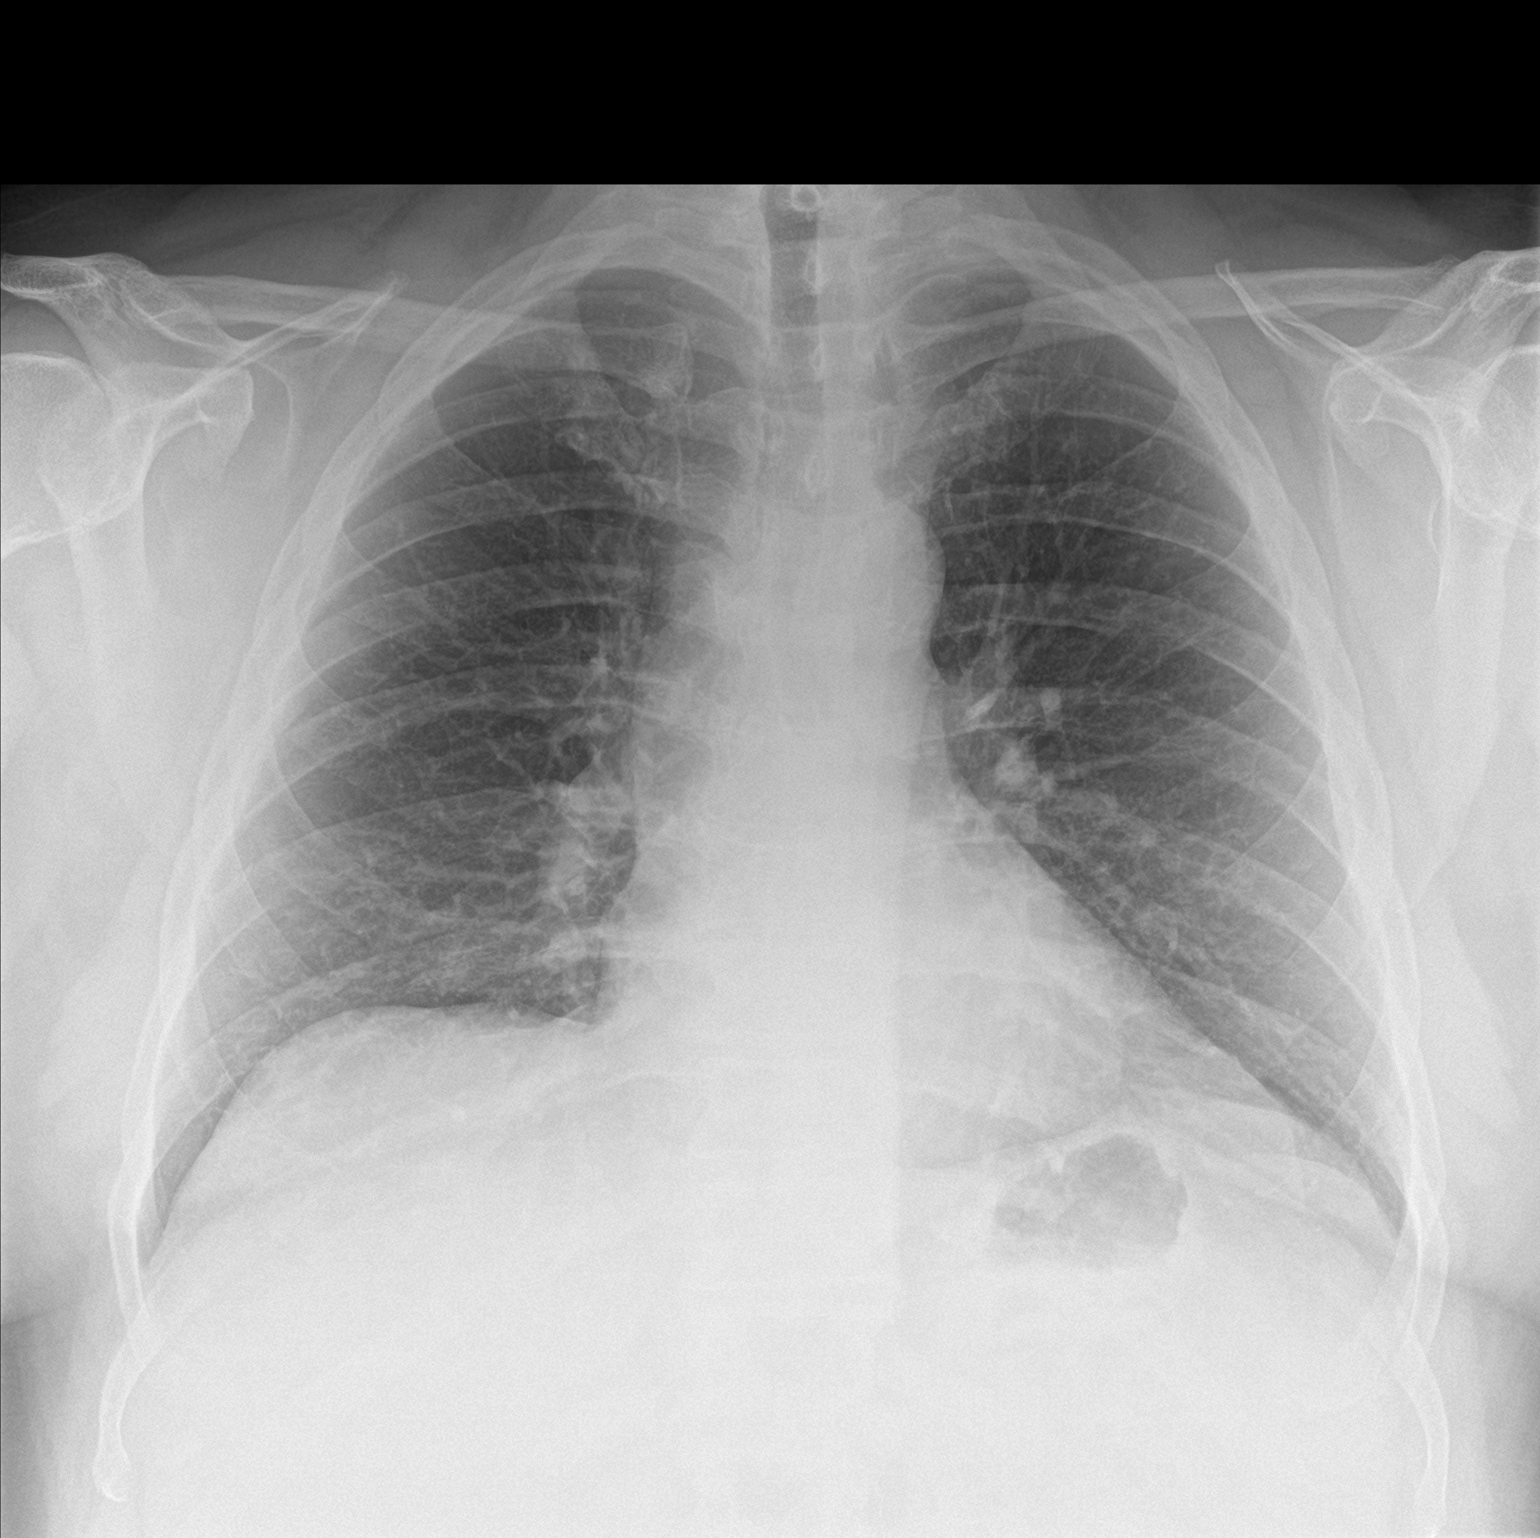

[chest lat]
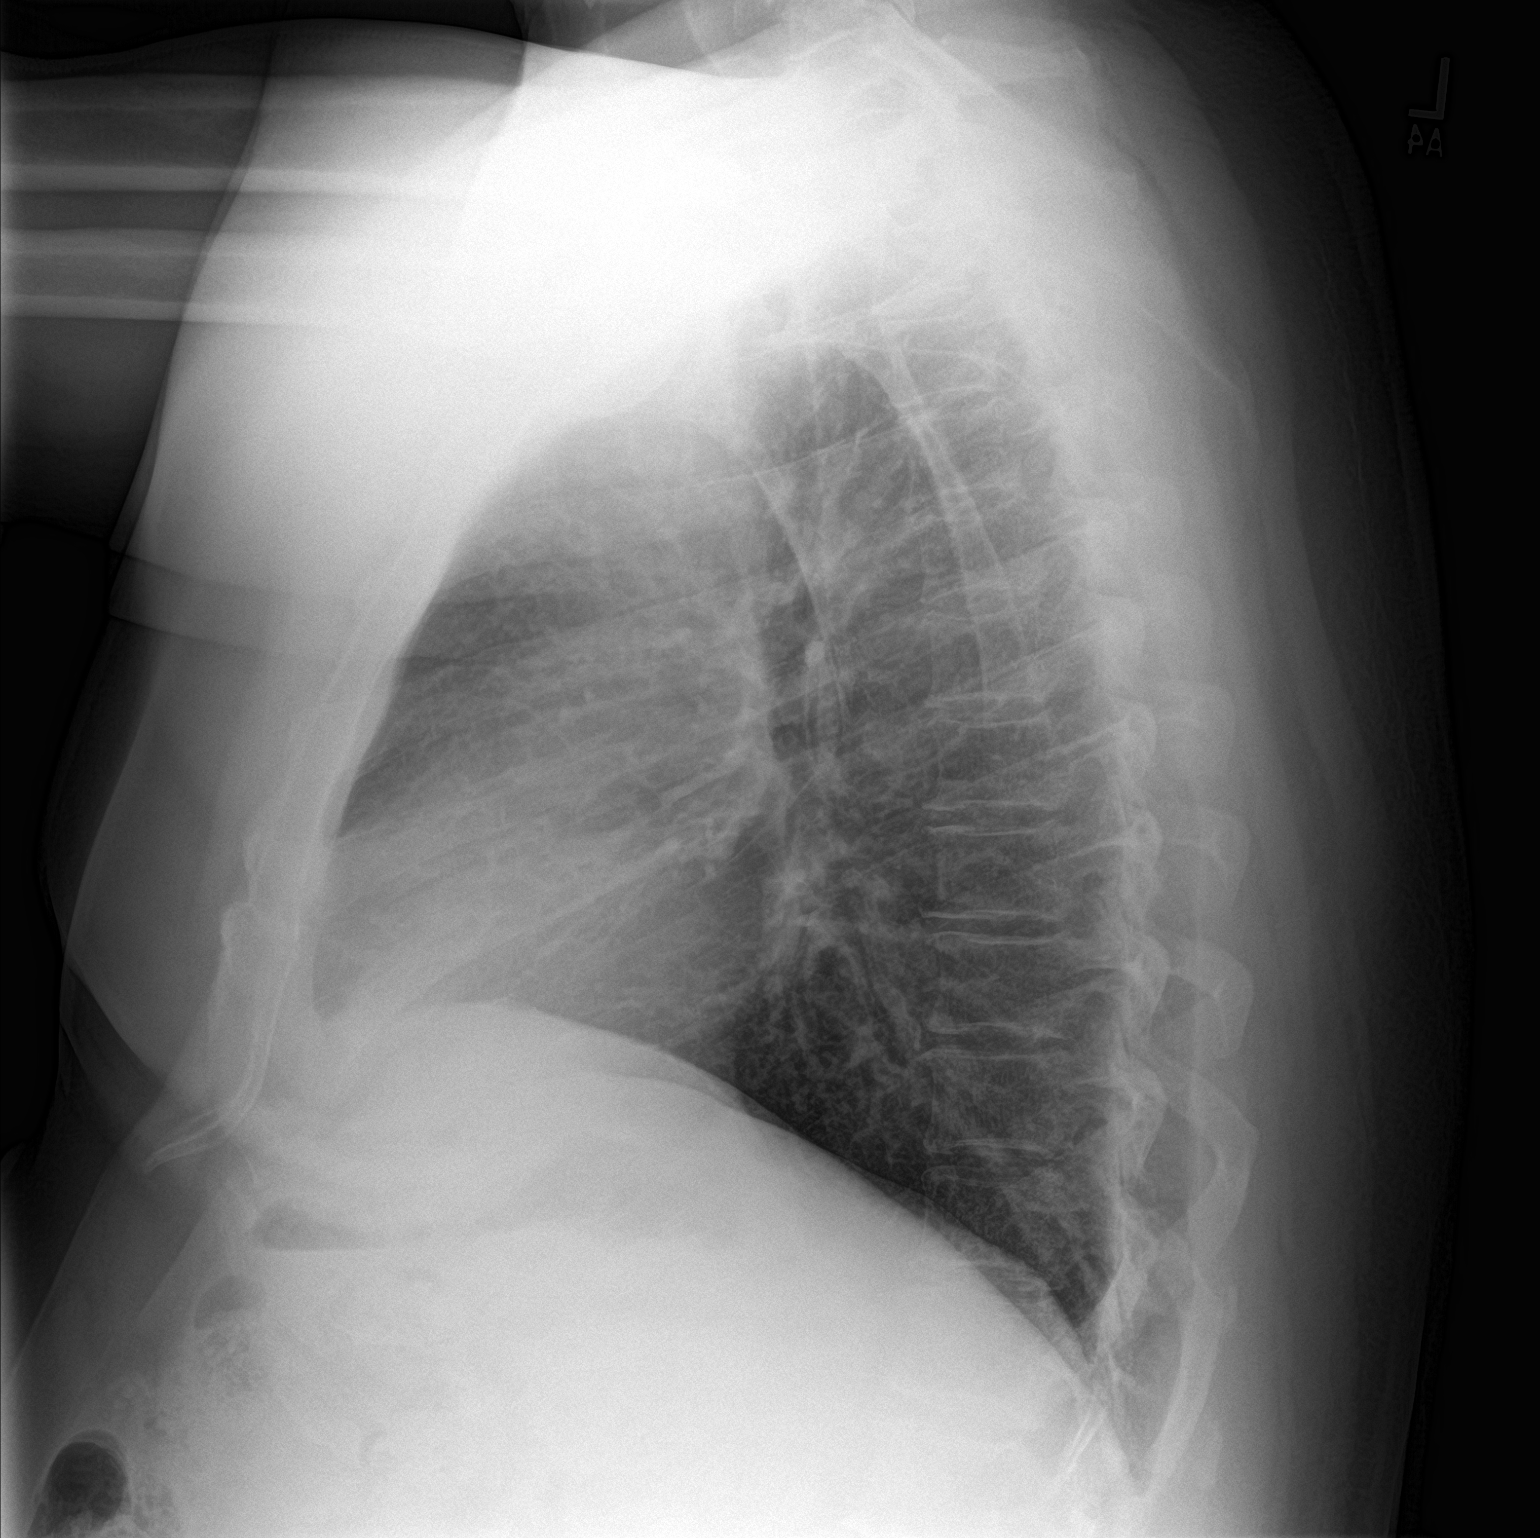

[2 of 2 positions shown; findings below may reference images not displayed]

FINDINGS: The lungs are adequately inflated. The interstitial markings are
coarse though stable. The heart and pulmonary vascularity are
normal. The mediastinum is normal in width. There is calcification
in the wall of the aortic arch. The bony thorax exhibits no acute
abnormality.
IMPRESSION: Chronic interstitial changes consistent with the patient's smoking
history. There is no evidence of metastatic disease nor other acute
cardiopulmonary abnormality.

Thoracic aortic atherosclerosis.

## 2018-03-10 ENCOUNTER — Ambulatory Visit (HOSPITAL_COMMUNITY)
Admission: RE | Admit: 2018-03-10 | Discharge: 2018-03-10 | Disposition: A | Discharge status: Home / Self Care | Payer: Medicare Other | Source: Ambulatory Visit | Attending: Pulmonary Disease | Admitting: Pulmonary Disease

## 2018-03-10 ENCOUNTER — Other Ambulatory Visit (HOSPITAL_COMMUNITY): Payer: Self-pay | Admitting: Pulmonary Disease

## 2018-03-10 DIAGNOSIS — C6990 Malignant neoplasm of unspecified site of unspecified eye: Secondary | ICD-10-CM

## 2018-03-10 DIAGNOSIS — Z85840 Personal history of malignant neoplasm of eye: Secondary | ICD-10-CM | POA: Diagnosis not present

## 2018-03-12 DIAGNOSIS — Z0000 Encounter for general adult medical examination without abnormal findings: Secondary | ICD-10-CM | POA: Diagnosis not present

## 2018-03-12 DIAGNOSIS — Z125 Encounter for screening for malignant neoplasm of prostate: Secondary | ICD-10-CM | POA: Diagnosis not present

## 2018-03-12 DIAGNOSIS — J45909 Unspecified asthma, uncomplicated: Secondary | ICD-10-CM | POA: Diagnosis not present

## 2018-03-12 DIAGNOSIS — C6990 Malignant neoplasm of unspecified site of unspecified eye: Secondary | ICD-10-CM | POA: Diagnosis not present

## 2018-03-12 DIAGNOSIS — E785 Hyperlipidemia, unspecified: Secondary | ICD-10-CM | POA: Diagnosis not present

## 2018-03-12 DIAGNOSIS — H409 Unspecified glaucoma: Secondary | ICD-10-CM | POA: Diagnosis not present

## 2018-03-12 DIAGNOSIS — K219 Gastro-esophageal reflux disease without esophagitis: Secondary | ICD-10-CM | POA: Diagnosis not present

## 2018-03-12 DIAGNOSIS — G4733 Obstructive sleep apnea (adult) (pediatric): Secondary | ICD-10-CM | POA: Diagnosis not present

## 2018-03-12 DIAGNOSIS — I10 Essential (primary) hypertension: Secondary | ICD-10-CM | POA: Diagnosis not present

## 2018-03-12 DIAGNOSIS — E669 Obesity, unspecified: Secondary | ICD-10-CM | POA: Diagnosis not present

## 2018-03-12 LAB — PSA: PSA: 0.5

## 2018-03-18 DIAGNOSIS — M545 Low back pain: Secondary | ICD-10-CM | POA: Diagnosis not present

## 2018-03-18 DIAGNOSIS — M9901 Segmental and somatic dysfunction of cervical region: Secondary | ICD-10-CM | POA: Diagnosis not present

## 2018-03-18 DIAGNOSIS — Z0000 Encounter for general adult medical examination without abnormal findings: Secondary | ICD-10-CM | POA: Diagnosis not present

## 2018-03-18 DIAGNOSIS — M546 Pain in thoracic spine: Secondary | ICD-10-CM | POA: Diagnosis not present

## 2018-03-18 DIAGNOSIS — M9903 Segmental and somatic dysfunction of lumbar region: Secondary | ICD-10-CM | POA: Diagnosis not present

## 2018-03-18 DIAGNOSIS — M9905 Segmental and somatic dysfunction of pelvic region: Secondary | ICD-10-CM | POA: Diagnosis not present

## 2018-03-18 DIAGNOSIS — M9902 Segmental and somatic dysfunction of thoracic region: Secondary | ICD-10-CM | POA: Diagnosis not present

## 2018-03-24 DIAGNOSIS — H35351 Cystoid macular degeneration, right eye: Secondary | ICD-10-CM | POA: Diagnosis not present

## 2018-03-24 DIAGNOSIS — C6931 Malignant neoplasm of right choroid: Secondary | ICD-10-CM | POA: Diagnosis not present

## 2018-03-26 DIAGNOSIS — Z1211 Encounter for screening for malignant neoplasm of colon: Secondary | ICD-10-CM | POA: Diagnosis not present

## 2018-03-26 LAB — FECAL OCCULT BLOOD, GUAIAC: Fecal Occult Blood: NEGATIVE

## 2018-03-31 ENCOUNTER — Encounter: Payer: Self-pay | Admitting: Family Medicine

## 2018-03-31 DIAGNOSIS — R739 Hyperglycemia, unspecified: Secondary | ICD-10-CM | POA: Diagnosis not present

## 2018-04-15 DIAGNOSIS — M9903 Segmental and somatic dysfunction of lumbar region: Secondary | ICD-10-CM | POA: Diagnosis not present

## 2018-04-15 DIAGNOSIS — M546 Pain in thoracic spine: Secondary | ICD-10-CM | POA: Diagnosis not present

## 2018-04-15 DIAGNOSIS — M9905 Segmental and somatic dysfunction of pelvic region: Secondary | ICD-10-CM | POA: Diagnosis not present

## 2018-04-15 DIAGNOSIS — M545 Low back pain: Secondary | ICD-10-CM | POA: Diagnosis not present

## 2018-04-15 DIAGNOSIS — M9902 Segmental and somatic dysfunction of thoracic region: Secondary | ICD-10-CM | POA: Diagnosis not present

## 2018-04-15 DIAGNOSIS — M9901 Segmental and somatic dysfunction of cervical region: Secondary | ICD-10-CM | POA: Diagnosis not present

## 2018-05-13 DIAGNOSIS — M9901 Segmental and somatic dysfunction of cervical region: Secondary | ICD-10-CM | POA: Diagnosis not present

## 2018-05-13 DIAGNOSIS — M546 Pain in thoracic spine: Secondary | ICD-10-CM | POA: Diagnosis not present

## 2018-05-13 DIAGNOSIS — M9902 Segmental and somatic dysfunction of thoracic region: Secondary | ICD-10-CM | POA: Diagnosis not present

## 2018-05-13 DIAGNOSIS — M545 Low back pain: Secondary | ICD-10-CM | POA: Diagnosis not present

## 2018-05-13 DIAGNOSIS — M9903 Segmental and somatic dysfunction of lumbar region: Secondary | ICD-10-CM | POA: Diagnosis not present

## 2018-05-13 DIAGNOSIS — M9905 Segmental and somatic dysfunction of pelvic region: Secondary | ICD-10-CM | POA: Diagnosis not present

## 2018-05-22 DIAGNOSIS — H2511 Age-related nuclear cataract, right eye: Secondary | ICD-10-CM | POA: Diagnosis not present

## 2018-05-25 DIAGNOSIS — Z08 Encounter for follow-up examination after completed treatment for malignant neoplasm: Secondary | ICD-10-CM | POA: Diagnosis not present

## 2018-05-25 DIAGNOSIS — D225 Melanocytic nevi of trunk: Secondary | ICD-10-CM | POA: Diagnosis not present

## 2018-05-25 DIAGNOSIS — Z85820 Personal history of malignant melanoma of skin: Secondary | ICD-10-CM | POA: Diagnosis not present

## 2018-05-25 DIAGNOSIS — Z1283 Encounter for screening for malignant neoplasm of skin: Secondary | ICD-10-CM | POA: Diagnosis not present

## 2018-06-03 DIAGNOSIS — I10 Essential (primary) hypertension: Secondary | ICD-10-CM | POA: Diagnosis not present

## 2018-06-03 DIAGNOSIS — C6990 Malignant neoplasm of unspecified site of unspecified eye: Secondary | ICD-10-CM | POA: Diagnosis not present

## 2018-06-03 DIAGNOSIS — G4733 Obstructive sleep apnea (adult) (pediatric): Secondary | ICD-10-CM | POA: Diagnosis not present

## 2018-06-03 DIAGNOSIS — H409 Unspecified glaucoma: Secondary | ICD-10-CM | POA: Diagnosis not present

## 2018-07-08 ENCOUNTER — Ambulatory Visit: Admit: 2018-07-08 | Payer: Medicare Other | Admitting: Ophthalmology

## 2018-07-08 SURGERY — PHACOEMULSIFICATION, CATARACT, WITH IOL INSERTION
Anesthesia: Topical | Laterality: Right

## 2018-07-13 DIAGNOSIS — H35351 Cystoid macular degeneration, right eye: Secondary | ICD-10-CM | POA: Diagnosis not present

## 2018-09-16 DIAGNOSIS — H409 Unspecified glaucoma: Secondary | ICD-10-CM | POA: Diagnosis not present

## 2018-09-16 DIAGNOSIS — K219 Gastro-esophageal reflux disease without esophagitis: Secondary | ICD-10-CM | POA: Diagnosis not present

## 2018-09-16 DIAGNOSIS — G4733 Obstructive sleep apnea (adult) (pediatric): Secondary | ICD-10-CM | POA: Diagnosis not present

## 2018-09-16 DIAGNOSIS — C6990 Malignant neoplasm of unspecified site of unspecified eye: Secondary | ICD-10-CM | POA: Diagnosis not present

## 2018-09-16 DIAGNOSIS — E669 Obesity, unspecified: Secondary | ICD-10-CM | POA: Diagnosis not present

## 2018-09-16 DIAGNOSIS — J45909 Unspecified asthma, uncomplicated: Secondary | ICD-10-CM | POA: Diagnosis not present

## 2018-09-16 DIAGNOSIS — I10 Essential (primary) hypertension: Secondary | ICD-10-CM | POA: Diagnosis not present

## 2018-09-16 DIAGNOSIS — E785 Hyperlipidemia, unspecified: Secondary | ICD-10-CM | POA: Diagnosis not present

## 2018-09-16 DIAGNOSIS — R739 Hyperglycemia, unspecified: Secondary | ICD-10-CM | POA: Diagnosis not present

## 2018-09-16 LAB — BASIC METABOLIC PANEL
BUN: 14 (ref 4–21)
CO2: 27 — AB (ref 13–22)
Chloride: 105 (ref 99–108)
Creatinine: 1 (ref <=1.3)
Glucose: 107
Potassium: 4.2 (ref 3.4–5.3)
Sodium: 140 (ref 137–147)

## 2018-09-16 LAB — LIPID PANEL
Cholesterol: 162 (ref 0–200)
HDL: 40 (ref 35–70)
LDL Cholesterol: 85
Triglycerides: 311 — AB (ref 40–160)

## 2018-09-16 LAB — CBC AND DIFFERENTIAL
HCT: 44 (ref 41–53)
Hemoglobin: 14.8 (ref 13.5–17.5)
Platelets: 260 (ref 150–399)
WBC: 7

## 2018-09-16 LAB — HEPATIC FUNCTION PANEL
ALT: 25 (ref 10–40)
AST: 19 (ref 14–40)
Alkaline Phosphatase: 72 (ref 25–125)

## 2018-09-16 LAB — COMPREHENSIVE METABOLIC PANEL
Albumin: 4.1 (ref 3.5–5.0)
Calcium: 9.3 (ref 8.7–10.7)
GFR calc Af Amer: 87
GFR calc non Af Amer: 75
Globulin: 2.2

## 2018-09-16 LAB — CBC: RBC: 4.99 (ref 3.87–5.11)

## 2018-09-16 LAB — HEMOGLOBIN A1C: Hemoglobin A1C: 6

## 2018-09-24 DIAGNOSIS — E669 Obesity, unspecified: Secondary | ICD-10-CM | POA: Diagnosis not present

## 2018-09-24 DIAGNOSIS — I10 Essential (primary) hypertension: Secondary | ICD-10-CM | POA: Diagnosis not present

## 2018-09-24 DIAGNOSIS — G4733 Obstructive sleep apnea (adult) (pediatric): Secondary | ICD-10-CM | POA: Diagnosis not present

## 2018-09-24 DIAGNOSIS — R739 Hyperglycemia, unspecified: Secondary | ICD-10-CM | POA: Diagnosis not present

## 2018-10-06 DIAGNOSIS — H35351 Cystoid macular degeneration, right eye: Secondary | ICD-10-CM | POA: Diagnosis not present

## 2018-10-07 ENCOUNTER — Other Ambulatory Visit: Payer: Self-pay

## 2018-10-12 DIAGNOSIS — H2511 Age-related nuclear cataract, right eye: Secondary | ICD-10-CM | POA: Diagnosis not present

## 2018-10-12 DIAGNOSIS — I10 Essential (primary) hypertension: Secondary | ICD-10-CM | POA: Diagnosis not present

## 2018-10-13 ENCOUNTER — Other Ambulatory Visit: Payer: Self-pay

## 2018-10-13 ENCOUNTER — Encounter: Payer: Self-pay | Admitting: *Deleted

## 2018-10-14 NOTE — Discharge Instructions (Signed)

## 2018-10-16 ENCOUNTER — Other Ambulatory Visit: Payer: Self-pay

## 2018-10-16 ENCOUNTER — Other Ambulatory Visit
Admission: RE | Admit: 2018-10-16 | Discharge: 2018-10-16 | Disposition: A | Discharge status: Home / Self Care | Payer: Medicare Other | Source: Ambulatory Visit | Attending: Ophthalmology | Admitting: Ophthalmology

## 2018-10-16 DIAGNOSIS — Z20828 Contact with and (suspected) exposure to other viral communicable diseases: Secondary | ICD-10-CM | POA: Insufficient documentation

## 2018-10-16 DIAGNOSIS — Z01812 Encounter for preprocedural laboratory examination: Secondary | ICD-10-CM | POA: Diagnosis not present

## 2018-10-17 LAB — SARS CORONAVIRUS 2 (TAT 6-24 HRS): SARS Coronavirus 2: NEGATIVE

## 2018-10-21 ENCOUNTER — Ambulatory Visit: Payer: Medicare Other | Admitting: Anesthesiology

## 2018-10-21 ENCOUNTER — Other Ambulatory Visit: Payer: Self-pay

## 2018-10-21 ENCOUNTER — Encounter
Admission: RE | Disposition: A | Discharge status: Home / Self Care | Payer: Self-pay | Source: Home / Self Care | Attending: Ophthalmology

## 2018-10-21 ENCOUNTER — Ambulatory Visit
Admission: RE | Admit: 2018-10-21 | Discharge: 2018-10-21 | Disposition: A | Discharge status: Home / Self Care | Payer: Medicare Other | Attending: Ophthalmology | Admitting: Ophthalmology

## 2018-10-21 DIAGNOSIS — G4730 Sleep apnea, unspecified: Secondary | ICD-10-CM | POA: Insufficient documentation

## 2018-10-21 DIAGNOSIS — Z79899 Other long term (current) drug therapy: Secondary | ICD-10-CM | POA: Insufficient documentation

## 2018-10-21 DIAGNOSIS — M1990 Unspecified osteoarthritis, unspecified site: Secondary | ICD-10-CM | POA: Diagnosis not present

## 2018-10-21 DIAGNOSIS — E7800 Pure hypercholesterolemia, unspecified: Secondary | ICD-10-CM | POA: Diagnosis not present

## 2018-10-21 DIAGNOSIS — H25811 Combined forms of age-related cataract, right eye: Secondary | ICD-10-CM | POA: Diagnosis not present

## 2018-10-21 DIAGNOSIS — Z6837 Body mass index (BMI) 37.0-37.9, adult: Secondary | ICD-10-CM | POA: Diagnosis not present

## 2018-10-21 DIAGNOSIS — I10 Essential (primary) hypertension: Secondary | ICD-10-CM | POA: Diagnosis not present

## 2018-10-21 DIAGNOSIS — H2511 Age-related nuclear cataract, right eye: Secondary | ICD-10-CM | POA: Diagnosis not present

## 2018-10-21 HISTORY — PX: CATARACT EXTRACTION W/PHACO: SHX586

## 2018-10-21 SURGERY — PHACOEMULSIFICATION, CATARACT, WITH IOL INSERTION
Anesthesia: Monitor Anesthesia Care | Site: Eye | Laterality: Right

## 2018-10-21 MED ORDER — CEFUROXIME OPHTHALMIC INJECTION 1 MG/0.1 ML
INJECTION | OPHTHALMIC | Status: DC | PRN
  Administered 2018-10-21: 0.1 mL via INTRACAMERAL

## 2018-10-21 MED ORDER — LIDOCAINE HCL (PF) 2 % IJ SOLN
INTRAOCULAR | Status: DC | PRN
  Administered 2018-10-21: 1 mL

## 2018-10-21 MED ORDER — FENTANYL CITRATE (PF) 100 MCG/2ML IJ SOLN
INTRAMUSCULAR | Status: DC | PRN
  Administered 2018-10-21 (×2): 50 ug via INTRAVENOUS

## 2018-10-21 MED ORDER — MIDAZOLAM HCL 2 MG/2ML IJ SOLN
INTRAMUSCULAR | Status: DC | PRN
  Administered 2018-10-21: 2 mg via INTRAVENOUS

## 2018-10-21 MED ORDER — EPINEPHRINE PF 1 MG/ML IJ SOLN
INTRAOCULAR | Status: DC | PRN
  Administered 2018-10-21: 56 mL via OPHTHALMIC

## 2018-10-21 MED ORDER — ACETAMINOPHEN 325 MG PO TABS: 325.0000 mg | ORAL_TABLET | Freq: Once | ORAL | Status: DC

## 2018-10-21 MED ORDER — TETRACAINE HCL 0.5 % OP SOLN
1.0000 [drp] | OPHTHALMIC | Status: DC | PRN
  Administered 2018-10-21 (×3): 1 [drp] via OPHTHALMIC

## 2018-10-21 MED ORDER — ACETAMINOPHEN 160 MG/5ML PO SOLN: 325.0000 mg | Freq: Once | ORAL | Status: DC

## 2018-10-21 MED ORDER — NA HYALUR & NA CHOND-NA HYALUR 0.4-0.35 ML IO KIT
PACK | INTRAOCULAR | Status: DC | PRN
  Administered 2018-10-21: 1 mL via INTRAOCULAR

## 2018-10-21 MED ORDER — TIMOLOL MALEATE 0.5 % OP SOLN
OPHTHALMIC | Status: DC | PRN
  Administered 2018-10-21: 1 [drp] via OPHTHALMIC

## 2018-10-21 MED ORDER — MOXIFLOXACIN HCL 0.5 % OP SOLN
1.0000 [drp] | OPHTHALMIC | Status: DC | PRN
  Administered 2018-10-21 (×3): 1 [drp] via OPHTHALMIC

## 2018-10-21 MED ORDER — ARMC OPHTHALMIC DILATING DROPS
1.0000 "application " | OPHTHALMIC | Status: DC | PRN
  Administered 2018-10-21 (×3): 1 via OPHTHALMIC

## 2018-10-21 SURGICAL SUPPLY — 16 items
CANNULA ANT/CHMB 27G (MISCELLANEOUS) ×1 IMPLANT
CANNULA ANT/CHMB 27GA (MISCELLANEOUS) ×3 IMPLANT
GLOVE SURG LX 7.5 STRW (GLOVE) ×2
GLOVE SURG LX STRL 7.5 STRW (GLOVE) ×1 IMPLANT
GLOVE SURG TRIUMPH 8.0 PF LTX (GLOVE) ×3 IMPLANT
GOWN STRL REUS W/ TWL LRG LVL3 (GOWN DISPOSABLE) ×2 IMPLANT
GOWN STRL REUS W/TWL LRG LVL3 (GOWN DISPOSABLE) ×4
LENS IOL TECNIS ITEC 18.0 (Intraocular Lens) ×2 IMPLANT
MARKER SKIN DUAL TIP RULER LAB (MISCELLANEOUS) ×3 IMPLANT
PACK CATARACT BRASINGTON (MISCELLANEOUS) ×3 IMPLANT
PACK EYE AFTER SURG (MISCELLANEOUS) ×3 IMPLANT
PACK OPTHALMIC (MISCELLANEOUS) ×3 IMPLANT
SYR 3ML LL SCALE MARK (SYRINGE) ×3 IMPLANT
SYR TB 1ML LUER SLIP (SYRINGE) ×3 IMPLANT
WATER STERILE IRR 500ML POUR (IV SOLUTION) ×3 IMPLANT
WIPE NON LINTING 3.25X3.25 (MISCELLANEOUS) ×3 IMPLANT

## 2018-10-21 NOTE — OR Nursing (Signed)
One drop Timolol Maleate 0.5% given in right eye at end of cataract procedure.

## 2018-10-21 NOTE — H&P (Signed)

## 2018-10-21 NOTE — Anesthesia Procedure Notes (Signed)
Procedure Name: [place] Performed by: Cameron Ali, CRNA Pre-anesthesia Checklist: Patient identified, Emergency Drugs available, Suction available, Timeout performed and Patient being monitored Patient Re-evaluated:Patient Re-evaluated prior to induction Oxygen Delivery Method: Nasal cannula Placement Confirmation: positive ETCO2

## 2018-10-21 NOTE — Op Note (Signed)
LOCATION:  [place]   PREOPERATIVE DIAGNOSIS:    Nuclear sclerotic cataract right eye. H25.11   POSTOPERATIVE DIAGNOSIS:  Nuclear sclerotic cataract right eye.     PROCEDURE:  Phacoemusification with posterior chamber intraocular lens placement of the right eye   LENS:   Implant Name Type Inv. Item Serial No. Manufacturer Lot No. LRB No. Used Action  LENS IOL DIOP 18.0 - [id number] Intraocular Lens LENS IOL DIOP 18.0 [id number] AMO  Right 1 Implanted        ULTRASOUND TIME: 16 % of 0 minutes, 37 seconds.  CDE 6.0   SURGEON:  Wyonia Hough, MD   ANESTHESIA:  Topical with tetracaine drops and 2% Xylocaine jelly, augmented with 1% preservative-free intracameral lidocaine.    COMPLICATIONS:  None.   DESCRIPTION OF PROCEDURE:  The patient was identified in the holding room and transported to the operating room and placed in the supine position under the operating microscope.  The right eye was identified as the operative eye and it was prepped and draped in the usual sterile ophthalmic fashion.   A 1 millimeter clear-corneal paracentesis was made at the 12:00 position.  0.5 ml of preservative-free 1% lidocaine was injected into the anterior chamber. The anterior chamber was filled with Viscoat viscoelastic.  A 2.4 millimeter keratome was used to make a near-clear corneal incision at the 9:00 position.  A curvilinear capsulorrhexis was made with a cystotome and capsulorrhexis forceps.  Balanced salt solution was used to hydrodissect and hydrodelineate the nucleus.   Phacoemulsification was then used in stop and chop fashion to remove the lens nucleus and epinucleus.  The remaining cortex was then removed using the irrigation and aspiration handpiece. Provisc was then placed into the capsular bag to distend it for lens placement.  A lens was then injected into the capsular bag.  The remaining viscoelastic was aspirated.   Wounds were hydrated with balanced salt solution.   The anterior chamber was inflated to a physiologic pressure with balanced salt solution.  No wound leaks were noted. Cefuroxime 0.1 ml of a 10mg /ml solution was injected into the anterior chamber for a dose of 1 mg of intracameral antibiotic at the completion of the case.   Timolol drops were applied to the eye.  The patient was taken to the recovery room in stable condition without complications of anesthesia or surgery.   [place] 10/21/2018, 1:13 PM

## 2018-10-21 NOTE — Anesthesia Postprocedure Evaluation (Signed)
Anesthesia Post Note  Patient: Justin Montes  Procedure(s) Performed: CATARACT EXTRACTION PHACO AND INTRAOCULAR LENS PLACEMENT (IOC)  RIGHT (Right Eye)  Patient location during evaluation: PACU Anesthesia Type: MAC Level of consciousness: awake and alert and oriented Pain management: satisfactory to patient Vital Signs Assessment: post-procedure vital signs reviewed and stable Respiratory status: spontaneous breathing, nonlabored ventilation and respiratory function stable Cardiovascular status: blood pressure returned to baseline and stable Postop Assessment: Adequate PO intake and No signs of nausea or vomiting Anesthetic complications: no    Raliegh Ip

## 2018-10-21 NOTE — Anesthesia Preprocedure Evaluation (Signed)
Anesthesia Evaluation  Patient identified by MRN, date of birth, ID band Patient awake    Reviewed: Allergy & Precautions, H&P , NPO status , Patient's Chart, lab work & pertinent test results  Airway Mallampati: II  TM Distance: >3 FB Neck ROM: full    Dental no notable dental hx.    Pulmonary sleep apnea and Continuous Positive Airway Pressure Ventilation , former smoker,    Pulmonary exam normal breath sounds clear to auscultation       Cardiovascular hypertension, Normal cardiovascular exam Rhythm:regular Rate:Normal     Neuro/Psych    GI/Hepatic GERD  ,  Endo/Other  Morbid obesity  Renal/GU      Musculoskeletal   Abdominal   Peds  Hematology   Anesthesia Other Findings   Reproductive/Obstetrics                             Anesthesia Physical Anesthesia Plan  ASA: III  Anesthesia Plan: MAC   Post-op Pain Management:    Induction:   PONV Risk Score and Plan: 1 and Midazolam, TIVA and Treatment may vary due to age or medical condition  Airway Management Planned:   Additional Equipment:   Intra-op Plan:   Post-operative Plan:   Informed Consent: I have reviewed the patients History and Physical, chart, labs and discussed the procedure including the risks, benefits and alternatives for the proposed anesthesia with the patient or authorized representative who has indicated his/her understanding and acceptance.       Plan Discussed with: CRNA  Anesthesia Plan Comments:         Anesthesia Quick Evaluation

## 2018-10-21 NOTE — Transfer of Care (Signed)
Immediate Anesthesia Transfer of Care Note  Patient: Justin Montes  Procedure(s) Performed: CATARACT EXTRACTION PHACO AND INTRAOCULAR LENS PLACEMENT (IOC)  RIGHT (Right Eye)  Patient Location: PACU  Anesthesia Type: MAC  Level of Consciousness: awake, alert  and patient cooperative  Airway and Oxygen Therapy: Patient Spontanous Breathing and Patient connected to supplemental oxygen  Post-op Assessment: Post-op Vital signs reviewed, Patient's Cardiovascular Status Stable, Respiratory Function Stable, Patent Airway and No signs of Nausea or vomiting  Post-op Vital Signs: Reviewed and stable  Complications: No apparent anesthesia complications

## 2018-10-22 ENCOUNTER — Encounter: Payer: Self-pay | Admitting: Ophthalmology

## 2018-11-20 DIAGNOSIS — M9901 Segmental and somatic dysfunction of cervical region: Secondary | ICD-10-CM | POA: Diagnosis not present

## 2018-11-20 DIAGNOSIS — M9903 Segmental and somatic dysfunction of lumbar region: Secondary | ICD-10-CM | POA: Diagnosis not present

## 2018-11-20 DIAGNOSIS — M545 Low back pain: Secondary | ICD-10-CM | POA: Diagnosis not present

## 2018-11-20 DIAGNOSIS — M9902 Segmental and somatic dysfunction of thoracic region: Secondary | ICD-10-CM | POA: Diagnosis not present

## 2018-11-20 DIAGNOSIS — M546 Pain in thoracic spine: Secondary | ICD-10-CM | POA: Diagnosis not present

## 2018-12-07 DIAGNOSIS — Z23 Encounter for immunization: Secondary | ICD-10-CM | POA: Diagnosis not present

## 2018-12-29 DIAGNOSIS — H35351 Cystoid macular degeneration, right eye: Secondary | ICD-10-CM | POA: Diagnosis not present

## 2019-02-17 DIAGNOSIS — H409 Unspecified glaucoma: Secondary | ICD-10-CM | POA: Insufficient documentation

## 2019-02-17 DIAGNOSIS — C6990 Malignant neoplasm of unspecified site of unspecified eye: Secondary | ICD-10-CM | POA: Insufficient documentation

## 2019-02-17 DIAGNOSIS — J45909 Unspecified asthma, uncomplicated: Secondary | ICD-10-CM

## 2019-02-17 DIAGNOSIS — E785 Hyperlipidemia, unspecified: Secondary | ICD-10-CM

## 2019-02-17 DIAGNOSIS — E669 Obesity, unspecified: Secondary | ICD-10-CM | POA: Insufficient documentation

## 2019-02-17 DIAGNOSIS — G4733 Obstructive sleep apnea (adult) (pediatric): Secondary | ICD-10-CM

## 2019-02-24 ENCOUNTER — Ambulatory Visit: Payer: Medicare Other | Admitting: Family Medicine

## 2019-03-17 DIAGNOSIS — M545 Low back pain: Secondary | ICD-10-CM | POA: Diagnosis not present

## 2019-03-17 DIAGNOSIS — M9902 Segmental and somatic dysfunction of thoracic region: Secondary | ICD-10-CM | POA: Diagnosis not present

## 2019-03-17 DIAGNOSIS — M9901 Segmental and somatic dysfunction of cervical region: Secondary | ICD-10-CM | POA: Diagnosis not present

## 2019-03-17 DIAGNOSIS — M9903 Segmental and somatic dysfunction of lumbar region: Secondary | ICD-10-CM | POA: Diagnosis not present

## 2019-03-17 DIAGNOSIS — M546 Pain in thoracic spine: Secondary | ICD-10-CM | POA: Diagnosis not present

## 2019-03-29 ENCOUNTER — Other Ambulatory Visit: Payer: Self-pay

## 2019-03-29 ENCOUNTER — Encounter: Payer: Self-pay | Admitting: Family Medicine

## 2019-03-29 ENCOUNTER — Ambulatory Visit (INDEPENDENT_AMBULATORY_CARE_PROVIDER_SITE_OTHER): Payer: Medicare Other | Admitting: Family Medicine

## 2019-03-29 VITALS — BP 138/80 | HR 84 | Temp 97.5°F | Ht 70.0 in | Wt 271.0 lb

## 2019-03-29 DIAGNOSIS — C6990 Malignant neoplasm of unspecified site of unspecified eye: Secondary | ICD-10-CM | POA: Diagnosis not present

## 2019-03-29 DIAGNOSIS — K219 Gastro-esophageal reflux disease without esophagitis: Secondary | ICD-10-CM | POA: Diagnosis not present

## 2019-03-29 DIAGNOSIS — R7309 Other abnormal glucose: Secondary | ICD-10-CM | POA: Diagnosis not present

## 2019-03-29 DIAGNOSIS — E785 Hyperlipidemia, unspecified: Secondary | ICD-10-CM | POA: Diagnosis not present

## 2019-03-29 DIAGNOSIS — I10 Essential (primary) hypertension: Secondary | ICD-10-CM | POA: Diagnosis not present

## 2019-03-29 NOTE — Patient Instructions (Addendum)
labwork 25months follow up Pt to have eye specialist notify for appropriate follow up from eye melanoma Diabetes Mellitus and Nutrition, Adult When you have diabetes (diabetes mellitus), it is very important to have healthy eating habits because your blood sugar (glucose) levels are greatly affected by what you eat and drink. Eating healthy foods in the appropriate amounts, at about the same times every day, can help you:  Control your blood glucose.  Lower your risk of heart disease.  Improve your blood pressure.  Reach or maintain a healthy weight. Every person with diabetes is different, and each person has different needs for a meal plan. Your health care provider may recommend that you work with a diet and nutrition specialist (dietitian) to make a meal plan that is best for you. Your meal plan may vary depending on factors such as:  The calories you need.  The medicines you take.  Your weight.  Your blood glucose, blood pressure, and cholesterol levels.  Your activity level.  Other health conditions you have, such as heart or kidney disease. How do carbohydrates affect me? Carbohydrates, also called carbs, affect your blood glucose level more than any other type of food. Eating carbs naturally raises the amount of glucose in your blood. Carb counting is a method for keeping track of how many carbs you eat. Counting carbs is important to keep your blood glucose at a healthy level, especially if you use insulin or take certain oral diabetes medicines. It is important to know how many carbs you can safely have in each meal. This is different for every person. Your dietitian can help you calculate how many carbs you should have at each meal and for each snack. Foods that contain carbs include:  Bread, cereal, rice, pasta, and crackers.  Potatoes and corn.  Peas, beans, and lentils.  Milk and yogurt.  Fruit and juice.  Desserts, such as cakes, cookies, ice cream, and  candy. How does alcohol affect me? Alcohol can cause a sudden decrease in blood glucose (hypoglycemia), especially if you use insulin or take certain oral diabetes medicines. Hypoglycemia can be a life-threatening condition. Symptoms of hypoglycemia (sleepiness, dizziness, and confusion) are similar to symptoms of having too much alcohol. If your health care provider says that alcohol is safe for you, follow these guidelines:  Limit alcohol intake to no more than 1 drink per day for nonpregnant women and 2 drinks per day for men. One drink equals 12 oz of beer, 5 oz of wine, or 1 oz of hard liquor.  Do not drink on an empty stomach.  Keep yourself hydrated with water, diet soda, or unsweetened iced tea.  Keep in mind that regular soda, juice, and other mixers may contain a lot of sugar and must be counted as carbs. What are tips for following this plan?  Reading food labels  Start by checking the serving size on the "Nutrition Facts" label of packaged foods and drinks. The amount of calories, carbs, fats, and other nutrients listed on the label is based on one serving of the item. Many items contain more than one serving per package.  Check the total grams (g) of carbs in one serving. You can calculate the number of servings of carbs in one serving by dividing the total carbs by 15. For example, if a food has 30 g of total carbs, it would be equal to 2 servings of carbs.  Check the number of grams (g) of saturated and trans fats in one  serving. Choose foods that have low or no amount of these fats.  Check the number of milligrams (mg) of salt (sodium) in one serving. Most people should limit total sodium intake to less than 2,300 mg per day.  Always check the nutrition information of foods labeled as "low-fat" or "nonfat". These foods may be higher in added sugar or refined carbs and should be avoided.  Talk to your dietitian to identify your daily goals for nutrients listed on the  label. Shopping  Avoid buying canned, premade, or processed foods. These foods tend to be high in fat, sodium, and added sugar.  Shop around the outside edge of the grocery store. This includes fresh fruits and vegetables, bulk grains, fresh meats, and fresh dairy. Cooking  Use low-heat cooking methods, such as baking, instead of high-heat cooking methods like deep frying.  Cook using healthy oils, such as olive, canola, or sunflower oil.  Avoid cooking with butter, cream, or high-fat meats. Meal planning  Eat meals and snacks regularly, preferably at the same times every day. Avoid going long periods of time without eating.  Eat foods high in fiber, such as fresh fruits, vegetables, beans, and whole grains. Talk to your dietitian about how many servings of carbs you can eat at each meal.  Eat 4-6 ounces (oz) of lean protein each day, such as lean meat, chicken, fish, eggs, or tofu. One oz of lean protein is equal to: ? 1 oz of meat, chicken, or fish. ? 1 egg. ?  cup of tofu.  Eat some foods each day that contain healthy fats, such as avocado, nuts, seeds, and fish. Lifestyle  Check your blood glucose regularly.  Exercise regularly as told by your health care provider. This may include: ? 150 minutes of moderate-intensity or vigorous-intensity exercise each week. This could be brisk walking, biking, or water aerobics. ? Stretching and doing strength exercises, such as yoga or weightlifting, at least 2 times a week.  Take medicines as told by your health care provider.  Do not use any products that contain nicotine or tobacco, such as cigarettes and e-cigarettes. If you need help quitting, ask your health care provider.  Work with a Social worker or diabetes educator to identify strategies to manage stress and any emotional and social challenges. Questions to ask a health care provider  Do I need to meet with a diabetes educator?  Do I need to meet with a dietitian?  What  number can I call if I have questions?  When are the best times to check my blood glucose? Where to find more information:  American Diabetes Association: diabetes.org  Academy of Nutrition and Dietetics: www.eatright.[employer] of Diabetes and Digestive and Kidney Diseases (NIH): [employer] Summary  A healthy meal plan will help you control your blood glucose and maintain a healthy lifestyle.  Working with a diet and nutrition specialist (dietitian) can help you make a meal plan that is best for you.  Keep in mind that carbohydrates (carbs) and alcohol have immediate effects on your blood glucose levels. It is important to count carbs and to use alcohol carefully. This information is not intended to replace advice given to you by your health care provider. Make sure you discuss any questions you have with your health care provider. Document Revised: 02/07/2017 Document Reviewed: 04/01/2016 Elsevier Patient Education  2020 [employer].

## 2019-03-29 NOTE — Progress Notes (Signed)
Established Patient Office Visit  Subjective:  Patient ID: Justin Montes, male    DOB: [birth year]  Age: 69 y.o. MRN: [id number]  CC:  Chief Complaint  Patient presents with  . Establish Care  HTN-Losartan -daily Hyperlipidemia-Atorvastatin-struggles with triglycerides  HPI Justin Montes presents for HTN- 2005-right eye-melanoma-injections right eye-q 10-12 weeks-preserved sight in eye-cataract surgery -10/2019-drops -steroid right eye Left eye-20/20-timolol drops Glaucoma-bilat HTN-daily medication-Losartan Hyperlipidemia-stable-atorvastatin/omega 3  Past Medical History:  Diagnosis Date  . Arthritis    fingers  . Cancer ([place])    melona of the right eye  . Cataract   . Chronic kidney disease    kidney stone age 72  . GERD (gastroesophageal reflux disease)   . Hyperlipemia   . Hyperlipidemia, unspecified   . Hypertension   . Malignant neoplasm of unspecified site of unspecified eye ([place])   . Obesity, unspecified   . Obstructive sleep apnea (adult) (pediatric)   . Sleep apnea    10 on CPAP  . Unspecified asthma, uncomplicated   . Unspecified glaucoma     Past Surgical History:  Procedure Laterality Date  . APPENDECTOMY     age 20  . CATARACT EXTRACTION W/PHACO Right 10/21/2018   Procedure: CATARACT EXTRACTION PHACO AND INTRAOCULAR LENS PLACEMENT ([facility])  RIGHT;  Surgeon: Leandrew Koyanagi, MD;  Location: [facility];  Service: Ophthalmology;  Laterality: Right;  . COLONOSCOPY N/A 12/31/2012   Procedure: COLONOSCOPY;  Surgeon: Rogene Houston, MD;  [facility];  Service: Endoscopy;  Laterality: N/A;  730  . EYE SURGERY    . TRIGGER FINGER RELEASE     right and left thumbs  . VASECTOMY      Family History  Problem Relation Age of Onset  . Mental illness Mother   . Healthy Brother     Social History   Socioeconomic History  . Marital status: Married    Spouse name: Not on file  . Number of children: Not on file  . Years of  education: Not on file  . Highest education level: Not on file  Occupational History  . Occupation: retired  Tobacco Use  . Smoking status: Former Smoker    Types: Cigarettes    Quit date: 03/11/1973    Years since quitting: 46.0  . Smokeless tobacco: Never Used  Substance and Sexual Activity  . Alcohol use: No  . Drug use: Not Currently  . Sexual activity: Yes  Other Topics Concern  . Not on file  Social History Narrative  . Not on file   Social Determinants of Health   Financial Resource Strain:   . Difficulty of Paying Living Expenses: Not on file  Food Insecurity:   . Worried About Charity fundraiser in the Last Year: Not on file  . Ran Out of Food in the Last Year: Not on file  Transportation Needs:   . Lack of Transportation (Medical): Not on file  . Lack of Transportation (Non-Medical): Not on file  Physical Activity:   . Days of Exercise per Week: Not on file  . Minutes of Exercise per Session: Not on file  Stress:   . Feeling of Stress : Not on file  Social Connections:   . Frequency of Communication with Friends and Family: Not on file  . Frequency of Social Gatherings with Friends and Family: Not on file  . Attends Religious Services: Not on file  . Active Member of Clubs or Organizations: Not on file  . Attends  Club or Organization Meetings: Not on file  . Marital Status: Not on file  Intimate Partner Violence:   . Fear of Current or Ex-Partner: Not on file  . Emotionally Abused: Not on file  . Physically Abused: Not on file  . Sexually Abused: Not on file    Outpatient Medications Prior to Visit  Medication Sig Dispense Refill  . Ascorbic Acid (VITAMIN C) 1000 MG tablet Take 1,000 mg by mouth daily.    . Multiple Vitamins-Minerals (VITAMIN D3 COMPLETE PO) Take 1,000 mg by mouth daily.    . Zinc 100 MG TABS Take 100 mg by mouth daily.    Marland Kitchen atorvastatin (LIPITOR) 20 MG tablet Take 20 mg by mouth daily.     . Bevacizumab (AVASTIN IV) Inject into the vein  See admin instructions. Gets every 8 weeks    . Coenzyme Q10 (CO Q-10) 200 MG CAPS Take 1 capsule by mouth daily.    . Glucosamine-Chondroit-Vit C-Mn (GLUCOSAMINE-CHONDROITIN) TABS Take 1 tablet by mouth 2 (two) times daily. Strength: 1500mg /1200mg      . Krill Oil CAPS Take 1 capsule by mouth daily. Mega Red krill capsules     . losartan (COZAAR) 50 MG tablet Take 50 mg by mouth 2 (two) times daily.     . multivitamin (ONE-A-DAY MEN'S) TABS Take 1 tablet by mouth every morning.      . timolol (BETIMOL) 0.5 % ophthalmic solution Place 1 drop into both eyes 2 (two) times daily.    Marland Kitchen esomeprazole (NEXIUM) 40 MG capsule Take 40 mg by mouth daily before breakfast.      . ibuprofen (ADVIL,MOTRIN) 200 MG tablet Take 600 mg by mouth every 6 (six) hours as needed for mild pain.    . tamsulosin (FLOMAX) 0.4 MG CAPS capsule Take 0.4 mg by mouth.     No facility-administered medications prior to visit.    Allergies  Allergen Reactions  . Bee Venom Hives and Swelling  . Brimonidine Tartrate   . Hydrocodone Nausea Only    ROS Review of Systems  Constitutional: Negative.   HENT: Negative.   Eyes:       Melanoma  Respiratory: Negative.   Cardiovascular: Negative.   Gastrointestinal: Negative.   Endocrine: Negative.   Genitourinary: Negative.   Musculoskeletal: Negative.   Psychiatric/Behavioral: Negative.       Objective:    Physical Exam  Constitutional: He is oriented to person, place, and time. He appears well-developed and well-nourished.  HENT:  Head: Normocephalic and atraumatic.  Eyes: Conjunctivae are normal.  Cardiovascular: Normal rate, regular rhythm, normal heart sounds and intact distal pulses.  Pulmonary/Chest: Effort normal and breath sounds normal.  Neurological: He is oriented to person, place, and time.  Psychiatric: He has a normal mood and affect.    BP 138/80 (BP Location: Left Arm, Patient Position: Sitting, Cuff Size: Normal)   Pulse 84   Temp (!) 97.5 F  (36.4 C) (Temporal)   Ht 5\' 10"  (1.778 m)   Wt 271 lb (122.9 kg)   SpO2 97%   BMI 38.88 kg/m  Wt Readings from Last 3 Encounters:  03/29/19 271 lb (122.9 kg)  10/21/18 259 lb 14.8 oz (117.9 kg)  10/04/13 246 lb 4.8 oz (111.7 kg)     Health Maintenance Due  Topic Date Due  . Hepatitis C Screening  [birth year]  . TETANUS/TDAP  02/01/1969    No results found for: TSH Lab Results  Component Value Date   WBC 7.0 09/16/2018  HGB 14.8 09/16/2018   HCT 44 09/16/2018   MCV 87.1 08/17/2013   PLT 260 09/16/2018   Lab Results  Component Value Date   NA 140 09/16/2018   K 4.2 09/16/2018   CO2 27 (A) 09/16/2018   GLUCOSE 131 (H) 08/17/2013   BUN 14 09/16/2018   CREATININE 1.0 09/16/2018   BILITOT 0.4 04/06/2014   ALKPHOS 72 09/16/2018   AST 19 09/16/2018   ALT 25 09/16/2018   PROT 6.5 04/06/2014   ALBUMIN 4.1 09/16/2018   CALCIUM 9.3 09/16/2018   Lab Results  Component Value Date   CHOL 162 09/16/2018   Lab Results  Component Value Date   HDL 40 09/16/2018   Lab Results  Component Value Date   LDLCALC 85 09/16/2018   Lab Results  Component Value Date   TRIG 311 (A) 09/16/2018   No results found for: [facility] Lab Results  Component Value Date   HGBA1C 6.0 09/16/2018      Assessment & Plan:   1. Essential hypertension, benign Losartan-stable-renal function , u/a  2. Hyperlipidemia, unspecified hyperlipidemia type Atorvastatin-lipid panel normal 7/20-recheck in 6 months  3. Malignant neoplasm of eye, unspecified laterality ([place]) 2/21-eye specialist-workup to be determined by specialist-pt to call if additional w/u needed-no longer seeing oncology-likely stage 1 as radiation directed at the eye in 2005-cxr 2019 and CT 2016  4. Gastroesophageal reflux disease without esophagitis No medication currently symptoms have resolved with diet changes  5. Elevated hemoglobin A1c Repeat A1c-diet recommendations in pt education Follow-up: 6 months   [place], CMA

## 2019-03-30 LAB — URINALYSIS
Bilirubin Urine: NEGATIVE
Glucose, UA: NEGATIVE
Hgb urine dipstick: NEGATIVE
Ketones, ur: NEGATIVE
Leukocytes,Ua: NEGATIVE
Nitrite: NEGATIVE
Protein, ur: NEGATIVE
Specific Gravity, Urine: 1.015 (ref 1.001–1.03)
pH: 7.5 (ref 5.0–8.0)

## 2019-03-30 LAB — BASIC METABOLIC PANEL
BUN: 12 mg/dL (ref 7–25)
CO2: 29 mmol/L (ref 20–32)
Calcium: 9.5 mg/dL (ref 8.6–10.3)
Chloride: 104 mmol/L (ref 98–110)
Creat: 0.92 mg/dL (ref 0.70–1.25)
Glucose, Bld: 100 mg/dL — ABNORMAL HIGH (ref 65–99)
Potassium: 4.2 mmol/L (ref 3.5–5.3)
Sodium: 141 mmol/L (ref 135–146)

## 2019-03-30 LAB — HEMOGLOBIN A1C
Hgb A1c MFr Bld: 6.1 %{Hb} — ABNORMAL HIGH
Mean Plasma Glucose: 128 (calc)
eAG (mmol/L): 7.1 (calc)

## 2019-04-03 ENCOUNTER — Encounter: Payer: Self-pay | Admitting: Family Medicine

## 2019-04-12 ENCOUNTER — Other Ambulatory Visit: Payer: Self-pay | Admitting: Family Medicine

## 2019-04-12 DIAGNOSIS — R7309 Other abnormal glucose: Secondary | ICD-10-CM

## 2019-04-14 DIAGNOSIS — M9903 Segmental and somatic dysfunction of lumbar region: Secondary | ICD-10-CM | POA: Diagnosis not present

## 2019-04-14 DIAGNOSIS — M9901 Segmental and somatic dysfunction of cervical region: Secondary | ICD-10-CM | POA: Diagnosis not present

## 2019-04-14 DIAGNOSIS — M545 Low back pain: Secondary | ICD-10-CM | POA: Diagnosis not present

## 2019-04-14 DIAGNOSIS — M9902 Segmental and somatic dysfunction of thoracic region: Secondary | ICD-10-CM | POA: Diagnosis not present

## 2019-04-14 DIAGNOSIS — M546 Pain in thoracic spine: Secondary | ICD-10-CM | POA: Diagnosis not present

## 2019-04-20 DIAGNOSIS — H35351 Cystoid macular degeneration, right eye: Secondary | ICD-10-CM | POA: Diagnosis not present

## 2019-05-04 DIAGNOSIS — H35351 Cystoid macular degeneration, right eye: Secondary | ICD-10-CM | POA: Diagnosis not present

## 2019-05-10 DIAGNOSIS — Z08 Encounter for follow-up examination after completed treatment for malignant neoplasm: Secondary | ICD-10-CM | POA: Diagnosis not present

## 2019-05-10 DIAGNOSIS — Z85820 Personal history of malignant melanoma of skin: Secondary | ICD-10-CM | POA: Diagnosis not present

## 2019-05-10 DIAGNOSIS — D225 Melanocytic nevi of trunk: Secondary | ICD-10-CM | POA: Diagnosis not present

## 2019-05-10 DIAGNOSIS — Z1283 Encounter for screening for malignant neoplasm of skin: Secondary | ICD-10-CM | POA: Diagnosis not present

## 2019-05-12 DIAGNOSIS — M546 Pain in thoracic spine: Secondary | ICD-10-CM | POA: Diagnosis not present

## 2019-05-12 DIAGNOSIS — M9903 Segmental and somatic dysfunction of lumbar region: Secondary | ICD-10-CM | POA: Diagnosis not present

## 2019-05-12 DIAGNOSIS — M545 Low back pain: Secondary | ICD-10-CM | POA: Diagnosis not present

## 2019-05-12 DIAGNOSIS — M9902 Segmental and somatic dysfunction of thoracic region: Secondary | ICD-10-CM | POA: Diagnosis not present

## 2019-05-12 DIAGNOSIS — M9901 Segmental and somatic dysfunction of cervical region: Secondary | ICD-10-CM | POA: Diagnosis not present

## 2019-05-24 ENCOUNTER — Encounter: Payer: Medicare Other | Attending: Family Medicine | Admitting: Nutrition

## 2019-05-24 ENCOUNTER — Other Ambulatory Visit: Payer: Self-pay

## 2019-05-24 ENCOUNTER — Encounter: Payer: Self-pay | Admitting: Nutrition

## 2019-05-24 VITALS — Ht 70.0 in | Wt 262.0 lb

## 2019-05-24 DIAGNOSIS — E782 Mixed hyperlipidemia: Secondary | ICD-10-CM

## 2019-05-24 DIAGNOSIS — K760 Fatty (change of) liver, not elsewhere classified: Secondary | ICD-10-CM

## 2019-05-24 DIAGNOSIS — E669 Obesity, unspecified: Secondary | ICD-10-CM

## 2019-05-24 DIAGNOSIS — R739 Hyperglycemia, unspecified: Secondary | ICD-10-CM

## 2019-05-24 NOTE — Progress Notes (Signed)
  Medical Nutrition Therapy:  Appt start time: 0800 end time:  0900.   Assessment:  Primary concerns today: Pre Dm.Justin Montes PMH Fatty liver, elevated TG and HTN.  Here with his wife.Sees Dr. Holly Bodily. He notes his A1C has increased and wants to prevent DM. Wants to lose weight. Eat eats 2 meals per day. Doen't eat a lot of vegetables. Skips breakfast. Not exercising much. Elevated TG 311 mg/dl. He notes they have always been high. Wants to change to improve his health and lose weight. Wife is supportive. Lab Results  Component Value Date   HGBA1C 6.1 (H) 03/29/2019   CMP Latest Ref Rng & Units 03/29/2019 09/16/2018 04/12/2014  Glucose 65 - 99 mg/dL 100(H) - -  BUN 7 - 25 mg/dL 12 14 -  Creatinine 0.70 - 1.25 mg/dL 0.92 1.0 1.20  Sodium 135 - 146 mmol/L 141 140 -  Potassium 3.5 - 5.3 mmol/L 4.2 4.2 -  Chloride 98 - 110 mmol/L 104 105 -  CO2 20 - 32 mmol/L 29 27(A) -  Calcium 8.6 - 10.3 mg/dL 9.5 9.3 -  Total Protein 6.0 - 8.3 g/dL - - -  Total Bilirubin 0.2 - 1.2 mg/dL - - -  Alkaline Phos 25 - 125 - 72 -  AST 14 - 40 - 19 -  ALT 10 - 40 - 25 -   Lipid Panel     Component Value Date/Time   CHOL 162 09/16/2018 0000   TRIG 311 (A) 09/16/2018 0000   HDL 40 09/16/2018 0000   LDLCALC 85 09/16/2018 0000   Preferred Learning Style:   No preference indicated   Learning Readiness:  Ready  Change in progress   MEDICATIONS:    DIETARY INTAKE:  24-hr recall:  B ( AM): skips Snk ( AM):   L ( PM): chicken tenders and potato fries, diet soda Snk ( PM):  D ( PM): chicken, baked potato, diet soda Snk ( PM): misc. water Beverages: diet sodas, coffee and 1 bottle of water per day.  Usual physical activity: ADL   Estimated energy needs: 1800  calories 200 g carbohydrates 135 g protein 50 g fat  Progress Towards Goal(s):  In progress.   Nutritional Diagnosis:  NI-1.5 Excessive energy intake As related to Obesity and Elevated Glucose.  As evidenced by BMI 37 and A1C 6.1%.     Intervention:  Nutrition and PRE Diabetes education provided on My Plate, CHO counting, meal planning, portion sizes, timing of meals, avoiding snacks between meals unless having a low blood sugar, target ranges for A1C and blood sugars, signs/symptoms and treatment of hyper/hypoglycemia, taking medications as prescribed, benefits of exercising 30 minutes per day and prevention of DM. Goals  Eat three meals per day according to My Plate Do not skip meals Cut out diet sodas and drink only water Walk 30 minutes a day Don't eat past 7 pm Increase lower carb vegetables with lunch and dinner daily. Lose 1-2 lbs per week  Teaching Method Utilized:  Visual Auditory Hands on  Handouts given during visit include:  The Plate Method   Meal Plan  Weight loss tips   Barriers to learning/adherence to lifestyle change: none  Demonstrated degree of understanding via:  Teach Back   Monitoring/Evaluation:  Dietary intake, exercise, , and body weight in 1 month(s).

## 2019-05-24 NOTE — Patient Instructions (Signed)
  Goals  Eat three meals per day according to My Plate Do not skip meals Cut out diet sodas and drink only water Walk 30 minutes a day Don't eat past 7 pm Increase lower carb vegetables with lunch and dinner daily. Lose 1-2 lbs per week

## 2019-05-25 DIAGNOSIS — H35351 Cystoid macular degeneration, right eye: Secondary | ICD-10-CM | POA: Diagnosis not present

## 2019-06-07 ENCOUNTER — Other Ambulatory Visit: Payer: Self-pay | Admitting: Emergency Medicine

## 2019-06-07 ENCOUNTER — Telehealth: Payer: Self-pay | Admitting: Family Medicine

## 2019-06-07 DIAGNOSIS — I10 Essential (primary) hypertension: Secondary | ICD-10-CM

## 2019-06-07 DIAGNOSIS — E785 Hyperlipidemia, unspecified: Secondary | ICD-10-CM

## 2019-06-07 MED ORDER — ATORVASTATIN CALCIUM 20 MG PO TABS: 20.0000 mg | ORAL_TABLET | Freq: Every day | ORAL | 1 refills | Status: AC

## 2019-06-07 MED ORDER — LOSARTAN POTASSIUM 50 MG PO TABS: 50.0000 mg | ORAL_TABLET | Freq: Two times a day (BID) | ORAL | 1 refills | Status: AC

## 2019-06-07 NOTE — Telephone Encounter (Signed)
Patient is calling and states he has 1 refill on Atorvastatin and no refills on losartan and would like refills sent to the pharmacy to last him until he can find a new pcp.   [facility] [phone] - [place], [address] Phone:  [phone]  Fax:  [phone]

## 2019-06-07 NOTE — Telephone Encounter (Signed)
Rx has been sent int the pharmacy

## 2019-06-16 DIAGNOSIS — M9903 Segmental and somatic dysfunction of lumbar region: Secondary | ICD-10-CM | POA: Diagnosis not present

## 2019-06-16 DIAGNOSIS — M545 Low back pain: Secondary | ICD-10-CM | POA: Diagnosis not present

## 2019-06-16 DIAGNOSIS — M546 Pain in thoracic spine: Secondary | ICD-10-CM | POA: Diagnosis not present

## 2019-06-16 DIAGNOSIS — M9901 Segmental and somatic dysfunction of cervical region: Secondary | ICD-10-CM | POA: Diagnosis not present

## 2019-06-16 DIAGNOSIS — M9902 Segmental and somatic dysfunction of thoracic region: Secondary | ICD-10-CM | POA: Diagnosis not present

## 2019-06-28 ENCOUNTER — Ambulatory Visit: Payer: Medicare Other | Admitting: Nutrition

## 2019-07-12 DIAGNOSIS — M9903 Segmental and somatic dysfunction of lumbar region: Secondary | ICD-10-CM | POA: Diagnosis not present

## 2019-07-12 DIAGNOSIS — M9901 Segmental and somatic dysfunction of cervical region: Secondary | ICD-10-CM | POA: Diagnosis not present

## 2019-07-12 DIAGNOSIS — M9902 Segmental and somatic dysfunction of thoracic region: Secondary | ICD-10-CM | POA: Diagnosis not present

## 2019-07-12 DIAGNOSIS — M545 Low back pain: Secondary | ICD-10-CM | POA: Diagnosis not present

## 2019-07-12 DIAGNOSIS — M546 Pain in thoracic spine: Secondary | ICD-10-CM | POA: Diagnosis not present

## 2019-07-23 DIAGNOSIS — G4733 Obstructive sleep apnea (adult) (pediatric): Secondary | ICD-10-CM | POA: Diagnosis not present

## 2019-07-23 DIAGNOSIS — R7309 Other abnormal glucose: Secondary | ICD-10-CM | POA: Diagnosis not present

## 2019-07-23 DIAGNOSIS — I10 Essential (primary) hypertension: Secondary | ICD-10-CM | POA: Diagnosis not present

## 2019-07-23 DIAGNOSIS — E7800 Pure hypercholesterolemia, unspecified: Secondary | ICD-10-CM | POA: Diagnosis not present

## 2019-07-23 DIAGNOSIS — Z79899 Other long term (current) drug therapy: Secondary | ICD-10-CM | POA: Diagnosis not present

## 2019-08-11 DIAGNOSIS — M9901 Segmental and somatic dysfunction of cervical region: Secondary | ICD-10-CM | POA: Diagnosis not present

## 2019-08-11 DIAGNOSIS — M9903 Segmental and somatic dysfunction of lumbar region: Secondary | ICD-10-CM | POA: Diagnosis not present

## 2019-08-11 DIAGNOSIS — M545 Low back pain: Secondary | ICD-10-CM | POA: Diagnosis not present

## 2019-08-11 DIAGNOSIS — M9902 Segmental and somatic dysfunction of thoracic region: Secondary | ICD-10-CM | POA: Diagnosis not present

## 2019-08-11 DIAGNOSIS — M546 Pain in thoracic spine: Secondary | ICD-10-CM | POA: Diagnosis not present

## 2019-08-16 DIAGNOSIS — H35351 Cystoid macular degeneration, right eye: Secondary | ICD-10-CM | POA: Diagnosis not present

## 2019-09-14 DIAGNOSIS — M7022 Olecranon bursitis, left elbow: Secondary | ICD-10-CM | POA: Diagnosis not present

## 2019-09-29 ENCOUNTER — Ambulatory Visit: Payer: Medicare Other | Admitting: Family Medicine

## 2019-10-11 DIAGNOSIS — M546 Pain in thoracic spine: Secondary | ICD-10-CM | POA: Diagnosis not present

## 2019-10-11 DIAGNOSIS — M9902 Segmental and somatic dysfunction of thoracic region: Secondary | ICD-10-CM | POA: Diagnosis not present

## 2019-10-11 DIAGNOSIS — M9903 Segmental and somatic dysfunction of lumbar region: Secondary | ICD-10-CM | POA: Diagnosis not present

## 2019-10-11 DIAGNOSIS — M545 Low back pain: Secondary | ICD-10-CM | POA: Diagnosis not present

## 2019-10-11 DIAGNOSIS — M9901 Segmental and somatic dysfunction of cervical region: Secondary | ICD-10-CM | POA: Diagnosis not present

## 2019-10-22 DIAGNOSIS — C6931 Malignant neoplasm of right choroid: Secondary | ICD-10-CM | POA: Diagnosis not present

## 2019-10-26 DIAGNOSIS — C6931 Malignant neoplasm of right choroid: Secondary | ICD-10-CM | POA: Diagnosis not present

## 2019-10-26 DIAGNOSIS — H35351 Cystoid macular degeneration, right eye: Secondary | ICD-10-CM | POA: Diagnosis not present

## 2019-11-10 DIAGNOSIS — M545 Low back pain: Secondary | ICD-10-CM | POA: Diagnosis not present

## 2019-11-10 DIAGNOSIS — M9902 Segmental and somatic dysfunction of thoracic region: Secondary | ICD-10-CM | POA: Diagnosis not present

## 2019-11-10 DIAGNOSIS — M9903 Segmental and somatic dysfunction of lumbar region: Secondary | ICD-10-CM | POA: Diagnosis not present

## 2019-11-10 DIAGNOSIS — M9901 Segmental and somatic dysfunction of cervical region: Secondary | ICD-10-CM | POA: Diagnosis not present

## 2019-11-10 DIAGNOSIS — M546 Pain in thoracic spine: Secondary | ICD-10-CM | POA: Diagnosis not present

## 2019-11-19 DIAGNOSIS — Z23 Encounter for immunization: Secondary | ICD-10-CM | POA: Diagnosis not present

## 2019-12-02 ENCOUNTER — Other Ambulatory Visit: Payer: Self-pay | Admitting: Family Medicine

## 2019-12-02 DIAGNOSIS — I10 Essential (primary) hypertension: Secondary | ICD-10-CM

## 2019-12-15 DIAGNOSIS — M9902 Segmental and somatic dysfunction of thoracic region: Secondary | ICD-10-CM | POA: Diagnosis not present

## 2019-12-15 DIAGNOSIS — M5136 Other intervertebral disc degeneration, lumbar region: Secondary | ICD-10-CM | POA: Diagnosis not present

## 2019-12-15 DIAGNOSIS — M9901 Segmental and somatic dysfunction of cervical region: Secondary | ICD-10-CM | POA: Diagnosis not present

## 2019-12-15 DIAGNOSIS — M9903 Segmental and somatic dysfunction of lumbar region: Secondary | ICD-10-CM | POA: Diagnosis not present

## 2019-12-15 DIAGNOSIS — M546 Pain in thoracic spine: Secondary | ICD-10-CM | POA: Diagnosis not present

## 2020-01-04 DIAGNOSIS — H35351 Cystoid macular degeneration, right eye: Secondary | ICD-10-CM | POA: Diagnosis not present

## 2020-01-05 DIAGNOSIS — Z23 Encounter for immunization: Secondary | ICD-10-CM | POA: Diagnosis not present

## 2020-01-06 ENCOUNTER — Encounter (INDEPENDENT_AMBULATORY_CARE_PROVIDER_SITE_OTHER): Payer: Self-pay | Admitting: *Deleted

## 2020-01-12 DIAGNOSIS — M9903 Segmental and somatic dysfunction of lumbar region: Secondary | ICD-10-CM | POA: Diagnosis not present

## 2020-01-12 DIAGNOSIS — M546 Pain in thoracic spine: Secondary | ICD-10-CM | POA: Diagnosis not present

## 2020-01-12 DIAGNOSIS — M5136 Other intervertebral disc degeneration, lumbar region: Secondary | ICD-10-CM | POA: Diagnosis not present

## 2020-01-12 DIAGNOSIS — M9901 Segmental and somatic dysfunction of cervical region: Secondary | ICD-10-CM | POA: Diagnosis not present

## 2020-01-12 DIAGNOSIS — M9902 Segmental and somatic dysfunction of thoracic region: Secondary | ICD-10-CM | POA: Diagnosis not present

## 2020-01-25 ENCOUNTER — Other Ambulatory Visit (INDEPENDENT_AMBULATORY_CARE_PROVIDER_SITE_OTHER): Payer: Self-pay

## 2020-01-25 DIAGNOSIS — Z86010 Personal history of colonic polyps: Secondary | ICD-10-CM

## 2020-01-26 DIAGNOSIS — E7800 Pure hypercholesterolemia, unspecified: Secondary | ICD-10-CM | POA: Diagnosis not present

## 2020-01-26 DIAGNOSIS — G4733 Obstructive sleep apnea (adult) (pediatric): Secondary | ICD-10-CM | POA: Diagnosis not present

## 2020-01-26 DIAGNOSIS — Z125 Encounter for screening for malignant neoplasm of prostate: Secondary | ICD-10-CM | POA: Diagnosis not present

## 2020-01-26 DIAGNOSIS — I10 Essential (primary) hypertension: Secondary | ICD-10-CM | POA: Diagnosis not present

## 2020-01-26 DIAGNOSIS — Z0001 Encounter for general adult medical examination with abnormal findings: Secondary | ICD-10-CM | POA: Diagnosis not present

## 2020-01-26 DIAGNOSIS — R7303 Prediabetes: Secondary | ICD-10-CM | POA: Diagnosis not present

## 2020-01-26 DIAGNOSIS — Z79899 Other long term (current) drug therapy: Secondary | ICD-10-CM | POA: Diagnosis not present

## 2020-02-09 DIAGNOSIS — M9901 Segmental and somatic dysfunction of cervical region: Secondary | ICD-10-CM | POA: Diagnosis not present

## 2020-02-09 DIAGNOSIS — M9902 Segmental and somatic dysfunction of thoracic region: Secondary | ICD-10-CM | POA: Diagnosis not present

## 2020-02-09 DIAGNOSIS — M546 Pain in thoracic spine: Secondary | ICD-10-CM | POA: Diagnosis not present

## 2020-02-09 DIAGNOSIS — M9903 Segmental and somatic dysfunction of lumbar region: Secondary | ICD-10-CM | POA: Diagnosis not present

## 2020-02-09 DIAGNOSIS — M5136 Other intervertebral disc degeneration, lumbar region: Secondary | ICD-10-CM | POA: Diagnosis not present

## 2020-02-14 ENCOUNTER — Telehealth (INDEPENDENT_AMBULATORY_CARE_PROVIDER_SITE_OTHER): Payer: Self-pay

## 2020-02-14 ENCOUNTER — Encounter (INDEPENDENT_AMBULATORY_CARE_PROVIDER_SITE_OTHER): Payer: Self-pay

## 2020-02-14 MED ORDER — NA SULFATE-K SULFATE-MG SULF 17.5-3.13-1.6 GM/177ML PO SOLN: 354.0000 mL | Freq: Once | ORAL | 0 refills | Status: AC

## 2020-02-14 NOTE — Telephone Encounter (Signed)
Justin Montes, CMA  

## 2020-02-14 NOTE — Telephone Encounter (Signed)
Referring MD/PCP: Koirala   Procedure: tcs  Reason/Indication:  Hx of polyps  Has patient had this procedure before?  yes  If so, when, by whom and where?  12/2012  Is there a family history of colon cancer?  no  Who?  What age when diagnosed?    Is patient diabetic?   no      Does patient have prosthetic heart valve or mechanical valve?  no  Do you have a pacemaker/defibrillator?  no  Has patient ever had endocarditis/atrial fibrillation? no  Have you had a stroke/heart attack last 6 mths? no  Does patient use oxygen? no  Has patient had joint replacement within last 12 months?  no  Is patient constipated or do they take laxatives? no  Does patient have a history of alcohol/drug use?  no  Is patient on blood thinner such as Coumadin, Plavix and/or Aspirin? no  Medications: Atorvastatin 20 mg daily, losartan 100mg  daily, timolo bid, co q 10 100mg  daily, mens mvi daily, glucosamine daily  Allergies: nkda  Procedure date & time: 03/16/20 11:15

## 2020-03-14 ENCOUNTER — Other Ambulatory Visit (HOSPITAL_COMMUNITY): Payer: Medicare Other

## 2020-03-14 ENCOUNTER — Other Ambulatory Visit: Payer: Self-pay

## 2020-03-14 ENCOUNTER — Other Ambulatory Visit (HOSPITAL_COMMUNITY)
Admission: RE | Admit: 2020-03-14 | Discharge: 2020-03-14 | Disposition: A | Discharge status: Home / Self Care | Payer: Medicare Other | Source: Ambulatory Visit | Attending: Internal Medicine | Admitting: Internal Medicine

## 2020-03-14 DIAGNOSIS — Z01812 Encounter for preprocedural laboratory examination: Secondary | ICD-10-CM | POA: Insufficient documentation

## 2020-03-14 DIAGNOSIS — Z20822 Contact with and (suspected) exposure to covid-19: Secondary | ICD-10-CM | POA: Insufficient documentation

## 2020-03-14 LAB — SARS CORONAVIRUS 2 (TAT 6-24 HRS): SARS Coronavirus 2: NEGATIVE

## 2020-03-16 ENCOUNTER — Encounter (HOSPITAL_COMMUNITY)
Admission: RE | Disposition: A | Discharge status: Home / Self Care | Payer: Self-pay | Source: Home / Self Care | Attending: Internal Medicine

## 2020-03-16 ENCOUNTER — Ambulatory Visit (HOSPITAL_COMMUNITY)
Admission: RE | Admit: 2020-03-16 | Discharge: 2020-03-16 | Disposition: A | Discharge status: Home / Self Care | Payer: Medicare Other | Attending: Internal Medicine | Admitting: Internal Medicine

## 2020-03-16 ENCOUNTER — Other Ambulatory Visit: Payer: Self-pay

## 2020-03-16 ENCOUNTER — Encounter (HOSPITAL_COMMUNITY): Payer: Self-pay | Admitting: Internal Medicine

## 2020-03-16 DIAGNOSIS — Z9852 Vasectomy status: Secondary | ICD-10-CM | POA: Diagnosis not present

## 2020-03-16 DIAGNOSIS — Z09 Encounter for follow-up examination after completed treatment for conditions other than malignant neoplasm: Secondary | ICD-10-CM | POA: Diagnosis not present

## 2020-03-16 DIAGNOSIS — K648 Other hemorrhoids: Secondary | ICD-10-CM | POA: Insufficient documentation

## 2020-03-16 DIAGNOSIS — K621 Rectal polyp: Secondary | ICD-10-CM | POA: Diagnosis not present

## 2020-03-16 DIAGNOSIS — D128 Benign neoplasm of rectum: Secondary | ICD-10-CM | POA: Insufficient documentation

## 2020-03-16 DIAGNOSIS — D123 Benign neoplasm of transverse colon: Secondary | ICD-10-CM | POA: Insufficient documentation

## 2020-03-16 DIAGNOSIS — Z86010 Personal history of colonic polyps: Secondary | ICD-10-CM | POA: Insufficient documentation

## 2020-03-16 DIAGNOSIS — Z87891 Personal history of nicotine dependence: Secondary | ICD-10-CM | POA: Diagnosis not present

## 2020-03-16 DIAGNOSIS — D125 Benign neoplasm of sigmoid colon: Secondary | ICD-10-CM | POA: Diagnosis not present

## 2020-03-16 DIAGNOSIS — Z79899 Other long term (current) drug therapy: Secondary | ICD-10-CM | POA: Insufficient documentation

## 2020-03-16 DIAGNOSIS — K644 Residual hemorrhoidal skin tags: Secondary | ICD-10-CM | POA: Insufficient documentation

## 2020-03-16 DIAGNOSIS — Z87442 Personal history of urinary calculi: Secondary | ICD-10-CM | POA: Insufficient documentation

## 2020-03-16 DIAGNOSIS — Z885 Allergy status to narcotic agent status: Secondary | ICD-10-CM | POA: Insufficient documentation

## 2020-03-16 DIAGNOSIS — D126 Benign neoplasm of colon, unspecified: Secondary | ICD-10-CM | POA: Diagnosis not present

## 2020-03-16 HISTORY — PX: POLYPECTOMY: SHX149

## 2020-03-16 HISTORY — PX: COLONOSCOPY: SHX5424

## 2020-03-16 LAB — HM COLONOSCOPY

## 2020-03-16 SURGERY — COLONOSCOPY
Anesthesia: Moderate Sedation

## 2020-03-16 MED ORDER — STERILE WATER FOR IRRIGATION IR SOLN
Status: DC | PRN
  Administered 2020-03-16: 2.5 mL

## 2020-03-16 MED ORDER — MEPERIDINE HCL 50 MG/ML IJ SOLN
INTRAMUSCULAR | Status: AC
  Filled 2020-03-16: qty 1

## 2020-03-16 MED ORDER — SODIUM CHLORIDE 0.9 % IV SOLN: INTRAVENOUS | Status: DC

## 2020-03-16 MED ORDER — MIDAZOLAM HCL 5 MG/5ML IJ SOLN
INTRAMUSCULAR | Status: AC
  Filled 2020-03-16: qty 10

## 2020-03-16 MED ORDER — MIDAZOLAM HCL 5 MG/5ML IJ SOLN
INTRAMUSCULAR | Status: DC | PRN
  Administered 2020-03-16 (×3): 2 mg via INTRAVENOUS
  Administered 2020-03-16 (×2): 1 mg via INTRAVENOUS

## 2020-03-16 MED ORDER — MEPERIDINE HCL 50 MG/ML IJ SOLN
INTRAMUSCULAR | Status: DC | PRN
  Administered 2020-03-16 (×2): 25 mg via INTRAVENOUS

## 2020-03-16 NOTE — Op Note (Signed)
[facility] Patient Name: Justin Montes Procedure Date: 03/16/2020 11:55 AM MRN: [id number] Date of Birth: March 08, 1951 Attending MD: Lionel December , MD CSN: [id number] Age: 70 Admit Type: Outpatient Procedure:                Colonoscopy Indications:              High risk colon cancer surveillance: Personal                            history of colonic polyps Providers:                Lionel December, MD, [place] Scrape RN, RN,                            Pandora Leiter, Technician, Crystal Page Referring MD:             Darrow Bussing, MD Medicines:                Meperidine 50 mg IV, Midazolam 8 mg IV Complications:            No immediate complications. Estimated Blood Loss:     Estimated blood loss was minimal. Procedure:                Pre-Anesthesia Assessment:                           - Prior to the procedure, a History and Physical                            was performed, and patient medications and                            allergies were reviewed. The patient's tolerance of                            previous anesthesia was also reviewed. The risks                            and benefits of the procedure and the sedation                            options and risks were discussed with the patient.                            All questions were answered, and informed consent                            was obtained. Prior Anticoagulants: The patient has                            taken no previous anticoagulant or antiplatelet                            agents. ASA Grade Assessment: II - A patient with  mild systemic disease. After reviewing the risks                            and benefits, the patient was deemed in                            satisfactory condition to undergo the procedure.                           After obtaining informed consent, the colonoscope                            was passed under direct vision. Throughout the                             procedure, the patient's blood pressure, pulse, and                            oxygen saturations were monitored continuously. The                            [id number]-[id number] scope was introduced through                            the anus and advanced to the the cecum, identified                            by appendiceal orifice and ileocecal valve. The                            colonoscopy was performed without difficulty. The                            patient tolerated the procedure well. The quality                            of the bowel preparation was adequate. The                            ileocecal valve, appendiceal orifice, and rectum                            were photographed. Scope In: 12:22:15 PM Scope Out: 12:47:45 PM Scope Withdrawal Time: 0 hours 12 minutes 23 seconds  Total Procedure Duration: 0 hours 25 minutes 30 seconds  Findings:      The perianal and digital rectal examinations were normal.      Four polyps were found in the rectum, proximal sigmoid colon and mid       transverse colon. The polyps were 3 to 6 mm in size. These polyps were       removed with a cold snare. Resection and retrieval were complete. The       pathology specimen was placed into Bottle Number 1.      External and internal hemorrhoids were found during retroflexion. The  hemorrhoids were small. Impression:               - Four 3 to 6 mm polyps in the rectum, in the                            proximal sigmoid colon and in the mid transverse                            colon, removed with a cold snare. Resected and                            retrieved.                           - External and internal hemorrhoids. Moderate Sedation:      Moderate (conscious) sedation was administered by the endoscopy nurse       and supervised by the endoscopist. The following parameters were       monitored: oxygen saturation, heart rate, blood pressure, CO2        capnography and response to care. Total physician intraservice time was       32 minutes. Recommendation:           - Patient has a contact number available for                            emergencies. The signs and symptoms of potential                            delayed complications were discussed with the                            patient. Return to normal activities tomorrow.                            Written discharge instructions were provided to the                            patient.                           - Resume previous diet today.                           - Continue present medications.                           - No aspirin, ibuprofen, naproxen, or other                            non-steroidal anti-inflammatory drugs for 1 day.                           - Await pathology results.                           - Repeat colonoscopy is recommended. The  colonoscopy date will be determined after pathology                            results from today's exam become available for                            review. Procedure Code(s):        --- Professional ---                           [phone], Colonoscopy, flexible; with removal of                            tumor(s), polyp(s), or other lesion(s) by snare                            technique                           99153, Moderate sedation; each additional 15                            minutes intraservice time                           G0500, Moderate sedation services provided by the                            same physician or other qualified health care                            professional performing a gastrointestinal                            endoscopic service that sedation supports,                            requiring the presence of an independent trained                            observer to assist in the monitoring of the                            patient's level of consciousness  and physiological                            status; initial 15 minutes of intra-service time;                            patient age 26 years or older (additional time may                            be reported with [phone], as appropriate) Diagnosis Code(s):        --- Professional ---  Z86.010, Personal history of colonic polyps                           K62.1, Rectal polyp                           K63.5, Polyp of colon                           K64.8, Other hemorrhoids CPT copyright 2019 American Medical Association. All rights reserved. The codes documented in this report are preliminary and upon coder review may  be revised to meet current compliance requirements. Hildred Laser, MD Hildred Laser, MD 03/16/2020 12:56:01 PM This report has been signed electronically. Number of Addenda: 0

## 2020-03-16 NOTE — Discharge Instructions (Signed)
Colonoscopy, Adult, Care After This sheet gives you information about how to care for yourself after your procedure. Your doctor may also give you more specific instructions. If you have problems or questions, call your doctor. What can I expect after the procedure? After the procedure, it is common to have:  A small amount of blood in your poop (stool) for 24 hours.  Some gas.  Mild cramping or bloating in your belly (abdomen). Follow these instructions at home: Eating and drinking   Drink enough fluid to keep your pee (urine) pale yellow.  Follow instructions from your doctor about what you cannot eat or drink.  Return to your normal diet as told by your doctor. Avoid heavy or fried foods that are hard to digest. Activity  Rest as told by your doctor.  Do not sit for a long time without moving. Get up to take short walks every 1-2 hours. This is important. Ask for help if you feel weak or unsteady.  Return to your normal activities as told by your doctor. Ask your doctor what activities are safe for you. To help cramping and bloating:   Try walking around.  Put heat on your belly as told by your doctor. Use the heat source that your doctor recommends, such as a moist heat pack or a heating pad. ? Put a towel between your skin and the heat source. ? Leave the heat on for 20-30 minutes. ? Remove the heat if your skin turns bright red. This is very important if you are unable to feel pain, heat, or cold. You may have a greater risk of getting burned. General instructions  For the first 24 hours after the procedure: ? Do not drive or use machinery. ? Do not sign important documents. ? Do not drink alcohol. ? Do your daily activities more slowly than normal. ? Eat foods that are soft and easy to digest.  Take over-the-counter or prescription medicines only as told by your doctor.  Keep all follow-up visits as told by your doctor. This is important. Contact a doctor  if:  You have blood in your poop 2-3 days after the procedure. Get help right away if:  You have more than a small amount of blood in your poop.  You see large clumps of tissue (blood clots) in your poop.  Your belly is swollen.  You feel like you may vomit (nauseous).  You vomit.  You have a fever.  You have belly pain that gets worse, and medicine does not help your pain. Summary  After the procedure, it is common to have a small amount of blood in your poop. You may also have mild cramping and bloating in your belly.  For the first 24 hours after the procedure, do not drive or use machinery, do not sign important documents, and do not drink alcohol.  Get help right away if you have a lot of blood in your poop, feel like you may vomit, have a fever, or have more belly pain. This information is not intended to replace advice given to you by your health care provider. Make sure you discuss any questions you have with your health care provider. Document Revised: 09/21/2018 Document Reviewed: 09/21/2018 Elsevier Patient Education  [place].  No aspirin or NSAIDs for 24 hours. Resume usual medications and diet as before No driving for 24 hours. Physician will call with biopsy results and further recommendations.   Colon Polyps  Polyps are tissue growths inside  the body. Polyps can grow in many places, including the large intestine (colon). A polyp may be a round bump or a mushroom-shaped growth. You could have one polyp or several. Most colon polyps are noncancerous (benign). However, some colon polyps can become cancerous over time. Finding and removing the polyps early can help prevent this. What are the causes? The exact cause of colon polyps is not known. What increases the risk? You are more likely to develop this condition if you:  Have a family history of colon cancer or colon polyps.  Are older than 68 or older than 45 if you are African American.  Have  inflammatory bowel disease, such as ulcerative colitis or Crohn's disease.  Have certain hereditary conditions, such as: ? Familial adenomatous polyposis. ? Lynch syndrome. ? Turcot syndrome. ? Peutz-Jeghers syndrome.  Are overweight.  Smoke cigarettes.  Do not get enough exercise.  Drink too much alcohol.  Eat a diet that is high in fat and red meat and low in fiber.  Had childhood cancer that was treated with abdominal radiation. What are the signs or symptoms? Most polyps do not cause symptoms. If you have symptoms, they may include:  Blood coming from your rectum when having a bowel movement.  Blood in your stool. The stool may look dark red or black.  Abdominal pain.  A change in bowel habits, such as constipation or diarrhea. How is this diagnosed? This condition is diagnosed with a colonoscopy. This is a procedure in which a lighted, flexible scope is inserted into the anus and then passed into the colon to examine the area. Polyps are sometimes found when a colonoscopy is done as part of routine cancer screening tests. How is this treated? Treatment for this condition involves removing any polyps that are found. Most polyps can be removed during a colonoscopy. Those polyps will then be tested for cancer. Additional treatment may be needed depending on the results of testing. Follow these instructions at home: Lifestyle  Maintain a healthy weight, or lose weight if recommended by your health care provider.  Exercise every day or as told by your health care provider.  Do not use any products that contain nicotine or tobacco, such as cigarettes and e-cigarettes. If you need help quitting, ask your health care provider.  If you drink alcohol, limit how much you have: ? 0-1 drink a day for women. ? 0-2 drinks a day for men.  Be aware of how much alcohol is in your drink. In the [place] one drink equals one 12 oz bottle of beer (355 mL), one 5 oz glass of wine (148 mL),  or one 1 oz shot of hard liquor (44 mL). Eating and drinking   Eat foods that are high in fiber, such as fruits, vegetables, and whole grains.  Eat foods that are high in calcium and vitamin D, such as milk, cheese, yogurt, eggs, liver, fish, and broccoli.  Limit foods that are high in fat, such as fried foods and desserts.  Limit the amount of red meat and processed meat you eat, such as hot dogs, sausage, bacon, and lunch meats. General instructions  Keep all follow-up visits as told by your health care provider. This is important. ? This includes having regularly scheduled colonoscopies. ? Talk to your health care provider about when you need a colonoscopy. Contact a health care provider if:  You have new or worsening bleeding during a bowel movement.  You have new or increased blood in your  stool.  You have a change in bowel habits.  You lose weight for no known reason. Summary  Polyps are tissue growths inside the body. Polyps can grow in many places, including the colon.  Most colon polyps are noncancerous (benign), but some can become cancerous over time.  This condition is diagnosed with a colonoscopy.  Treatment for this condition involves removing any polyps that are found. Most polyps can be removed during a colonoscopy. This information is not intended to replace advice given to you by your health care provider. Make sure you discuss any questions you have with your health care provider. Document Revised: 06/12/2017 Document Reviewed: 06/12/2017 Elsevier Patient Education  2020 [employer].   Hemorrhoids Hemorrhoids are swollen veins in and around the rectum or anus. There are two types of hemorrhoids:  Internal hemorrhoids. These occur in the veins that are just inside the rectum. They may poke through to the outside and become irritated and painful.  External hemorrhoids. These occur in the veins that are outside the anus and can be felt as a painful  swelling or hard lump near the anus. Most hemorrhoids do not cause serious problems, and they can be managed with home treatments such as diet and lifestyle changes. If home treatments do not help the symptoms, procedures can be done to shrink or remove the hemorrhoids. What are the causes? This condition is caused by increased pressure in the anal area. This pressure may result from various things, including:  Constipation.  Straining to have a bowel movement.  Diarrhea.  Pregnancy.  Obesity.  Sitting for long periods of time.  Heavy lifting or other activity that causes you to strain.  Anal sex.  Riding a bike for a long period of time. What are the signs or symptoms? Symptoms of this condition include:  Pain.  Anal itching or irritation.  Rectal bleeding.  Leakage of stool (feces).  Anal swelling.  One or more lumps around the anus. How is this diagnosed? This condition can often be diagnosed through a visual exam. Other exams or tests may also be done, such as:  An exam that involves feeling the rectal area with a gloved hand (digital rectal exam).  An exam of the anal canal that is done using a small tube (anoscope).  A blood test, if you have lost a significant amount of blood.  A test to look inside the colon using a flexible tube with a camera on the end (sigmoidoscopy or colonoscopy). How is this treated? This condition can usually be treated at home. However, various procedures may be done if dietary changes, lifestyle changes, and other home treatments do not help your symptoms. These procedures can help make the hemorrhoids smaller or remove them completely. Some of these procedures involve surgery, and others do not. Common procedures include:  Rubber band ligation. Rubber bands are placed at the base of the hemorrhoids to cut off their blood supply.  Sclerotherapy. Medicine is injected into the hemorrhoids to shrink them.  Infrared coagulation. A  type of light energy is used to get rid of the hemorrhoids.  Hemorrhoidectomy surgery. The hemorrhoids are surgically removed, and the veins that supply them are tied off.  Stapled hemorrhoidopexy surgery. The surgeon staples the base of the hemorrhoid to the rectal wall. Follow these instructions at home: Eating and drinking   Eat foods that have a lot of fiber in them, such as whole grains, beans, nuts, fruits, and vegetables.  Ask your health care provider  about taking products that have added fiber (fiber supplements).  Reduce the amount of fat in your diet. You can do this by eating low-fat dairy products, eating less red meat, and avoiding processed foods.  Drink enough fluid to keep your urine pale yellow. Managing pain and swelling   Take warm sitz baths for 20 minutes, 3-4 times a day to ease pain and discomfort. You may do this in a bathtub or using a portable sitz bath that fits over the toilet.  If directed, apply ice to the affected area. Using ice packs between sitz baths may be helpful. ? Put ice in a plastic bag. ? Place a towel between your skin and the bag. ? Leave the ice on for 20 minutes, 2-3 times a day. General instructions  Take over-the-counter and prescription medicines only as told by your health care provider.  Use medicated creams or suppositories as told.  Get regular exercise. Ask your health care provider how much and what kind of exercise is best for you. In general, you should do moderate exercise for at least 30 minutes on most days of the week (150 minutes each week). This can include activities such as walking, biking, or yoga.  Go to the bathroom when you have the urge to have a bowel movement. Do not wait.  Avoid straining to have bowel movements.  Keep the anal area dry and clean. Use wet toilet paper or moist towelettes after a bowel movement.  Do not sit on the toilet for long periods of time. This increases blood pooling and  pain.  Keep all follow-up visits as told by your health care provider. This is important. Contact a health care provider if you have:  Increasing pain and swelling that are not controlled by treatment or medicine.  Difficulty having a bowel movement, or you are unable to have a bowel movement.  Pain or inflammation outside the area of the hemorrhoids. Get help right away if you have:  Uncontrolled bleeding from your rectum. Summary  Hemorrhoids are swollen veins in and around the rectum or anus.  Most hemorrhoids can be managed with home treatments such as diet and lifestyle changes.  Taking warm sitz baths can help ease pain and discomfort.  In severe cases, procedures or surgery can be done to shrink or remove the hemorrhoids. This information is not intended to replace advice given to you by your health care provider. Make sure you discuss any questions you have with your health care provider. Document Revised: 07/24/2018 Document Reviewed: 07/17/2017 Elsevier Patient Education  [place].

## 2020-03-16 NOTE — H&P (Signed)
Justin Montes is an 70 y.o. male.   Chief Complaint: Patient is here for colonoscopy. HPI: Patient is 70 year old Caucasian male who has a history of colonic polyps. He had 2 sessile serrated adenomas removed in 2007.  His last exam was in October 2014 with removal of periappendiceal polyp but it was lymphoid polyp.  He was therefore advised to wait 7 years.  He has no GI complaints.  His bowels move regularly.  He denies abdominal pain or rectal bleeding. He does not take aspirin NSAIDs or anticoagulants. Family history is negative for CRC.  Past Medical History:  Diagnosis Date  . Arthritis    fingers  . 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 Cataract   .  History of kidney stone at age 38.      Marland Kitchen GERD (gastroesophageal reflux disease)   . Hyperlipemia   . Hyperlipidemia, unspecified   . Hypertension   . Malignant neoplasm of unspecified site of unspecified eye (HCC)   . Obesity, unspecified   . Obstructive sleep apnea (adult) (pediatric)   . Sleep apnea    10 on CPAP  . Unspecified asthma, uncomplicated   . Unspecified glaucoma     Past Surgical History:  Procedure Laterality Date  . APPENDECTOMY     age 10  . CATARACT EXTRACTION W/PHACO Right 10/21/2018   Procedure: CATARACT EXTRACTION PHACO AND INTRAOCULAR LENS PLACEMENT (IOC)  RIGHT;  Surgeon: Lockie Mola, MD;  Location: [facility];  Service: Ophthalmology;  Laterality: Right;  . COLONOSCOPY N/A 12/31/2012   Procedure: COLONOSCOPY;  Surgeon: Malissa Hippo, MD;  Location: [facility];  Service: Endoscopy;  Laterality: N/A;  730  . EYE SURGERY    . TRIGGER FINGER RELEASE     right and left thumbs  . VASECTOMY      Family History  Problem Relation Age of Onset  . Mental illness Mother   . Healthy Brother    Social History:  reports that he quit smoking about 47 years ago. His smoking use included cigarettes. He has never used smokeless tobacco. He reports previous drug use. He reports that  he does not drink alcohol.  Allergies:  Allergies  Allergen Reactions  . Bee Venom Hives and Swelling  . Brimonidine Tartrate Other (See Comments)    Red and watery eyes  . Hydrocodone Nausea Only    Medications Prior to Admission  Medication Sig Dispense Refill  . KRILL OIL PO Take 350 mg by mouth daily. Mega Red krill capsules    . losartan (COZAAR) 50 MG tablet Take 1 tablet (50 mg total) by mouth 2 (two) times daily. (Patient taking differently: Take 100 mg by mouth daily.) 90 tablet 1  . Ascorbic Acid (VITAMIN C) 1000 MG tablet Take 1,000 mg by mouth daily.    Marland Kitchen atorvastatin (LIPITOR) 20 MG tablet Take 1 tablet (20 mg total) by mouth daily. 90 tablet 1  . Bevacizumab (AVASTIN IV) Inject into the vein See admin instructions. Gets every 10 weeks    . cholecalciferol (VITAMIN D) 25 MCG (1000 UNIT) tablet Take 1,000 Units by mouth daily.    . Coenzyme Q10 (CO Q-10) 200 MG CAPS Take 200 mg by mouth daily.    . Glucosamine-Chondroit-Vit C-Mn (GLUCOSAMINE-CHONDROITIN) TABS Take 1 tablet by mouth 2 (two) times daily. Strength: 1500mg /1200mg     . multivitamin (ONE-A-DAY MEN'S) TABS Take 1 tablet by mouth every morning.    . timolol (BETIMOL) 0.5 % ophthalmic  solution Place 1 drop into both eyes 2 (two) times daily.      No results found for this or any previous visit (from the past 48 hour(s)). No results found.  Review of Systems  Blood pressure (!) 144/96, pulse 81, temperature 98 F (36.7 C), temperature source Oral, resp. rate 17, height 5\' 10"  (1.778 m), weight 113.4 kg, SpO2 98 %. Physical Exam HENT:     Mouth/Throat:     Mouth: Mucous membranes are moist.     Pharynx: Oropharynx is clear.  Eyes:     General: No scleral icterus.    Conjunctiva/sclera: Conjunctivae normal.  Cardiovascular:     Rate and Rhythm: Normal rate and regular rhythm.     Heart sounds: Normal heart sounds. No murmur heard.   Pulmonary:     Effort: Pulmonary effort is normal.     Breath sounds:  Normal breath sounds.  Abdominal:     Comments: Abdomen is full but soft and nontender without organomegaly or masses  Musculoskeletal:        General: No swelling.     Cervical back: Neck supple.  Lymphadenopathy:     Cervical: No cervical adenopathy.  Skin:    General: Skin is warm and dry.  Neurological:     Mental Status: He is alert.      Assessment/Plan  History of sessile serrated adenomas Surveillance colonoscopy.  , MD 03/16/2020, 12:08 PM

## 2020-03-17 LAB — SURGICAL PATHOLOGY

## 2020-03-20 ENCOUNTER — Encounter (HOSPITAL_COMMUNITY): Payer: Self-pay | Admitting: Internal Medicine

## 2020-04-03 ENCOUNTER — Encounter (INDEPENDENT_AMBULATORY_CARE_PROVIDER_SITE_OTHER): Payer: Self-pay | Admitting: *Deleted

## 2020-04-12 DIAGNOSIS — M9902 Segmental and somatic dysfunction of thoracic region: Secondary | ICD-10-CM | POA: Diagnosis not present

## 2020-04-12 DIAGNOSIS — M546 Pain in thoracic spine: Secondary | ICD-10-CM | POA: Diagnosis not present

## 2020-04-12 DIAGNOSIS — M5136 Other intervertebral disc degeneration, lumbar region: Secondary | ICD-10-CM | POA: Diagnosis not present

## 2020-04-12 DIAGNOSIS — M9901 Segmental and somatic dysfunction of cervical region: Secondary | ICD-10-CM | POA: Diagnosis not present

## 2020-04-12 DIAGNOSIS — M9903 Segmental and somatic dysfunction of lumbar region: Secondary | ICD-10-CM | POA: Diagnosis not present

## 2020-05-10 DIAGNOSIS — M9901 Segmental and somatic dysfunction of cervical region: Secondary | ICD-10-CM | POA: Diagnosis not present

## 2020-05-10 DIAGNOSIS — M546 Pain in thoracic spine: Secondary | ICD-10-CM | POA: Diagnosis not present

## 2020-05-10 DIAGNOSIS — M9903 Segmental and somatic dysfunction of lumbar region: Secondary | ICD-10-CM | POA: Diagnosis not present

## 2020-05-10 DIAGNOSIS — M9902 Segmental and somatic dysfunction of thoracic region: Secondary | ICD-10-CM | POA: Diagnosis not present

## 2020-05-10 DIAGNOSIS — M5136 Other intervertebral disc degeneration, lumbar region: Secondary | ICD-10-CM | POA: Diagnosis not present

## 2020-05-29 DIAGNOSIS — H35351 Cystoid macular degeneration, right eye: Secondary | ICD-10-CM | POA: Diagnosis not present

## 2020-06-15 DIAGNOSIS — Z1283 Encounter for screening for malignant neoplasm of skin: Secondary | ICD-10-CM | POA: Diagnosis not present

## 2020-06-15 DIAGNOSIS — D225 Melanocytic nevi of trunk: Secondary | ICD-10-CM | POA: Diagnosis not present

## 2020-06-15 DIAGNOSIS — Z08 Encounter for follow-up examination after completed treatment for malignant neoplasm: Secondary | ICD-10-CM | POA: Diagnosis not present

## 2020-06-15 DIAGNOSIS — Z85820 Personal history of malignant melanoma of skin: Secondary | ICD-10-CM | POA: Diagnosis not present

## 2020-07-06 DIAGNOSIS — G4733 Obstructive sleep apnea (adult) (pediatric): Secondary | ICD-10-CM | POA: Diagnosis not present

## 2020-07-12 DIAGNOSIS — M9903 Segmental and somatic dysfunction of lumbar region: Secondary | ICD-10-CM | POA: Diagnosis not present

## 2020-07-12 DIAGNOSIS — M546 Pain in thoracic spine: Secondary | ICD-10-CM | POA: Diagnosis not present

## 2020-07-12 DIAGNOSIS — M9902 Segmental and somatic dysfunction of thoracic region: Secondary | ICD-10-CM | POA: Diagnosis not present

## 2020-07-12 DIAGNOSIS — M5136 Other intervertebral disc degeneration, lumbar region: Secondary | ICD-10-CM | POA: Diagnosis not present

## 2020-07-12 DIAGNOSIS — M9901 Segmental and somatic dysfunction of cervical region: Secondary | ICD-10-CM | POA: Diagnosis not present

## 2020-07-25 DIAGNOSIS — H401111 Primary open-angle glaucoma, right eye, mild stage: Secondary | ICD-10-CM | POA: Diagnosis not present

## 2020-07-25 DIAGNOSIS — H2512 Age-related nuclear cataract, left eye: Secondary | ICD-10-CM | POA: Diagnosis not present

## 2020-07-27 DIAGNOSIS — G4733 Obstructive sleep apnea (adult) (pediatric): Secondary | ICD-10-CM | POA: Diagnosis not present

## 2020-07-27 DIAGNOSIS — E7800 Pure hypercholesterolemia, unspecified: Secondary | ICD-10-CM | POA: Diagnosis not present

## 2020-07-27 DIAGNOSIS — I10 Essential (primary) hypertension: Secondary | ICD-10-CM | POA: Diagnosis not present

## 2020-07-27 DIAGNOSIS — R7309 Other abnormal glucose: Secondary | ICD-10-CM | POA: Diagnosis not present

## 2020-07-27 DIAGNOSIS — Z79899 Other long term (current) drug therapy: Secondary | ICD-10-CM | POA: Diagnosis not present

## 2020-08-09 DIAGNOSIS — M9902 Segmental and somatic dysfunction of thoracic region: Secondary | ICD-10-CM | POA: Diagnosis not present

## 2020-08-09 DIAGNOSIS — M9903 Segmental and somatic dysfunction of lumbar region: Secondary | ICD-10-CM | POA: Diagnosis not present

## 2020-08-09 DIAGNOSIS — M9901 Segmental and somatic dysfunction of cervical region: Secondary | ICD-10-CM | POA: Diagnosis not present

## 2020-08-09 DIAGNOSIS — M5136 Other intervertebral disc degeneration, lumbar region: Secondary | ICD-10-CM | POA: Diagnosis not present

## 2020-08-09 DIAGNOSIS — M546 Pain in thoracic spine: Secondary | ICD-10-CM | POA: Diagnosis not present

## 2020-08-25 DIAGNOSIS — H35351 Cystoid macular degeneration, right eye: Secondary | ICD-10-CM | POA: Diagnosis not present

## 2020-09-13 DIAGNOSIS — M9902 Segmental and somatic dysfunction of thoracic region: Secondary | ICD-10-CM | POA: Diagnosis not present

## 2020-09-13 DIAGNOSIS — M546 Pain in thoracic spine: Secondary | ICD-10-CM | POA: Diagnosis not present

## 2020-09-13 DIAGNOSIS — M9901 Segmental and somatic dysfunction of cervical region: Secondary | ICD-10-CM | POA: Diagnosis not present

## 2020-09-13 DIAGNOSIS — M5136 Other intervertebral disc degeneration, lumbar region: Secondary | ICD-10-CM | POA: Diagnosis not present

## 2020-09-13 DIAGNOSIS — M9903 Segmental and somatic dysfunction of lumbar region: Secondary | ICD-10-CM | POA: Diagnosis not present

## 2020-10-04 DIAGNOSIS — G4733 Obstructive sleep apnea (adult) (pediatric): Secondary | ICD-10-CM | POA: Diagnosis not present

## 2020-10-11 DIAGNOSIS — M9902 Segmental and somatic dysfunction of thoracic region: Secondary | ICD-10-CM | POA: Diagnosis not present

## 2020-10-11 DIAGNOSIS — M5136 Other intervertebral disc degeneration, lumbar region: Secondary | ICD-10-CM | POA: Diagnosis not present

## 2020-10-11 DIAGNOSIS — M546 Pain in thoracic spine: Secondary | ICD-10-CM | POA: Diagnosis not present

## 2020-10-11 DIAGNOSIS — M9903 Segmental and somatic dysfunction of lumbar region: Secondary | ICD-10-CM | POA: Diagnosis not present

## 2020-10-11 DIAGNOSIS — M9901 Segmental and somatic dysfunction of cervical region: Secondary | ICD-10-CM | POA: Diagnosis not present

## 2020-11-14 DIAGNOSIS — H35351 Cystoid macular degeneration, right eye: Secondary | ICD-10-CM | POA: Diagnosis not present

## 2020-11-15 DIAGNOSIS — M9903 Segmental and somatic dysfunction of lumbar region: Secondary | ICD-10-CM | POA: Diagnosis not present

## 2020-11-15 DIAGNOSIS — M546 Pain in thoracic spine: Secondary | ICD-10-CM | POA: Diagnosis not present

## 2020-11-15 DIAGNOSIS — M9902 Segmental and somatic dysfunction of thoracic region: Secondary | ICD-10-CM | POA: Diagnosis not present

## 2020-11-15 DIAGNOSIS — M9901 Segmental and somatic dysfunction of cervical region: Secondary | ICD-10-CM | POA: Diagnosis not present

## 2020-11-15 DIAGNOSIS — M5136 Other intervertebral disc degeneration, lumbar region: Secondary | ICD-10-CM | POA: Diagnosis not present

## 2020-12-13 DIAGNOSIS — M9902 Segmental and somatic dysfunction of thoracic region: Secondary | ICD-10-CM | POA: Diagnosis not present

## 2020-12-13 DIAGNOSIS — M9901 Segmental and somatic dysfunction of cervical region: Secondary | ICD-10-CM | POA: Diagnosis not present

## 2020-12-13 DIAGNOSIS — M546 Pain in thoracic spine: Secondary | ICD-10-CM | POA: Diagnosis not present

## 2020-12-13 DIAGNOSIS — M5136 Other intervertebral disc degeneration, lumbar region: Secondary | ICD-10-CM | POA: Diagnosis not present

## 2020-12-13 DIAGNOSIS — M9903 Segmental and somatic dysfunction of lumbar region: Secondary | ICD-10-CM | POA: Diagnosis not present

## 2021-01-03 DIAGNOSIS — G4733 Obstructive sleep apnea (adult) (pediatric): Secondary | ICD-10-CM | POA: Diagnosis not present

## 2021-01-10 DIAGNOSIS — M9903 Segmental and somatic dysfunction of lumbar region: Secondary | ICD-10-CM | POA: Diagnosis not present

## 2021-01-10 DIAGNOSIS — M9902 Segmental and somatic dysfunction of thoracic region: Secondary | ICD-10-CM | POA: Diagnosis not present

## 2021-01-10 DIAGNOSIS — M5136 Other intervertebral disc degeneration, lumbar region: Secondary | ICD-10-CM | POA: Diagnosis not present

## 2021-01-10 DIAGNOSIS — M546 Pain in thoracic spine: Secondary | ICD-10-CM | POA: Diagnosis not present

## 2021-01-10 DIAGNOSIS — M9901 Segmental and somatic dysfunction of cervical region: Secondary | ICD-10-CM | POA: Diagnosis not present

## 2021-01-23 DIAGNOSIS — H35351 Cystoid macular degeneration, right eye: Secondary | ICD-10-CM | POA: Diagnosis not present

## 2021-01-29 DIAGNOSIS — H401111 Primary open-angle glaucoma, right eye, mild stage: Secondary | ICD-10-CM | POA: Diagnosis not present

## 2021-02-06 DIAGNOSIS — R7309 Other abnormal glucose: Secondary | ICD-10-CM | POA: Diagnosis not present

## 2021-02-06 DIAGNOSIS — R7303 Prediabetes: Secondary | ICD-10-CM | POA: Diagnosis not present

## 2021-02-06 DIAGNOSIS — Z23 Encounter for immunization: Secondary | ICD-10-CM | POA: Diagnosis not present

## 2021-02-06 DIAGNOSIS — E7800 Pure hypercholesterolemia, unspecified: Secondary | ICD-10-CM | POA: Diagnosis not present

## 2021-02-06 DIAGNOSIS — Z125 Encounter for screening for malignant neoplasm of prostate: Secondary | ICD-10-CM | POA: Diagnosis not present

## 2021-02-06 DIAGNOSIS — Z0001 Encounter for general adult medical examination with abnormal findings: Secondary | ICD-10-CM | POA: Diagnosis not present

## 2021-02-06 DIAGNOSIS — G4733 Obstructive sleep apnea (adult) (pediatric): Secondary | ICD-10-CM | POA: Diagnosis not present

## 2021-02-06 DIAGNOSIS — I10 Essential (primary) hypertension: Secondary | ICD-10-CM | POA: Diagnosis not present

## 2021-02-06 DIAGNOSIS — Z79899 Other long term (current) drug therapy: Secondary | ICD-10-CM | POA: Diagnosis not present

## 2021-02-14 DIAGNOSIS — M9903 Segmental and somatic dysfunction of lumbar region: Secondary | ICD-10-CM | POA: Diagnosis not present

## 2021-02-14 DIAGNOSIS — M9902 Segmental and somatic dysfunction of thoracic region: Secondary | ICD-10-CM | POA: Diagnosis not present

## 2021-02-14 DIAGNOSIS — M546 Pain in thoracic spine: Secondary | ICD-10-CM | POA: Diagnosis not present

## 2021-02-14 DIAGNOSIS — M5136 Other intervertebral disc degeneration, lumbar region: Secondary | ICD-10-CM | POA: Diagnosis not present

## 2021-02-14 DIAGNOSIS — M9901 Segmental and somatic dysfunction of cervical region: Secondary | ICD-10-CM | POA: Diagnosis not present

## 2021-03-14 DIAGNOSIS — M9901 Segmental and somatic dysfunction of cervical region: Secondary | ICD-10-CM | POA: Diagnosis not present

## 2021-03-14 DIAGNOSIS — M546 Pain in thoracic spine: Secondary | ICD-10-CM | POA: Diagnosis not present

## 2021-03-14 DIAGNOSIS — M9902 Segmental and somatic dysfunction of thoracic region: Secondary | ICD-10-CM | POA: Diagnosis not present

## 2021-03-14 DIAGNOSIS — M9903 Segmental and somatic dysfunction of lumbar region: Secondary | ICD-10-CM | POA: Diagnosis not present

## 2021-03-14 DIAGNOSIS — M5136 Other intervertebral disc degeneration, lumbar region: Secondary | ICD-10-CM | POA: Diagnosis not present

## 2021-04-03 DIAGNOSIS — G4733 Obstructive sleep apnea (adult) (pediatric): Secondary | ICD-10-CM | POA: Diagnosis not present

## 2021-04-03 DIAGNOSIS — H4061X0 Glaucoma secondary to drugs, right eye, stage unspecified: Secondary | ICD-10-CM | POA: Diagnosis not present

## 2021-04-03 DIAGNOSIS — H35351 Cystoid macular degeneration, right eye: Secondary | ICD-10-CM | POA: Diagnosis not present

## 2021-04-11 DIAGNOSIS — M546 Pain in thoracic spine: Secondary | ICD-10-CM | POA: Diagnosis not present

## 2021-04-11 DIAGNOSIS — M9901 Segmental and somatic dysfunction of cervical region: Secondary | ICD-10-CM | POA: Diagnosis not present

## 2021-04-11 DIAGNOSIS — M9902 Segmental and somatic dysfunction of thoracic region: Secondary | ICD-10-CM | POA: Diagnosis not present

## 2021-04-11 DIAGNOSIS — M5136 Other intervertebral disc degeneration, lumbar region: Secondary | ICD-10-CM | POA: Diagnosis not present

## 2021-04-11 DIAGNOSIS — M9903 Segmental and somatic dysfunction of lumbar region: Secondary | ICD-10-CM | POA: Diagnosis not present

## 2021-04-13 DIAGNOSIS — M546 Pain in thoracic spine: Secondary | ICD-10-CM | POA: Diagnosis not present

## 2021-04-13 DIAGNOSIS — M9903 Segmental and somatic dysfunction of lumbar region: Secondary | ICD-10-CM | POA: Diagnosis not present

## 2021-04-13 DIAGNOSIS — M9902 Segmental and somatic dysfunction of thoracic region: Secondary | ICD-10-CM | POA: Diagnosis not present

## 2021-04-13 DIAGNOSIS — M9901 Segmental and somatic dysfunction of cervical region: Secondary | ICD-10-CM | POA: Diagnosis not present

## 2021-04-13 DIAGNOSIS — M5136 Other intervertebral disc degeneration, lumbar region: Secondary | ICD-10-CM | POA: Diagnosis not present

## 2021-05-09 DIAGNOSIS — M9902 Segmental and somatic dysfunction of thoracic region: Secondary | ICD-10-CM | POA: Diagnosis not present

## 2021-05-09 DIAGNOSIS — M5136 Other intervertebral disc degeneration, lumbar region: Secondary | ICD-10-CM | POA: Diagnosis not present

## 2021-05-09 DIAGNOSIS — M9901 Segmental and somatic dysfunction of cervical region: Secondary | ICD-10-CM | POA: Diagnosis not present

## 2021-05-09 DIAGNOSIS — M9903 Segmental and somatic dysfunction of lumbar region: Secondary | ICD-10-CM | POA: Diagnosis not present

## 2021-05-09 DIAGNOSIS — M546 Pain in thoracic spine: Secondary | ICD-10-CM | POA: Diagnosis not present

## 2021-05-15 DIAGNOSIS — H35351 Cystoid macular degeneration, right eye: Secondary | ICD-10-CM | POA: Diagnosis not present

## 2021-06-12 DIAGNOSIS — H35351 Cystoid macular degeneration, right eye: Secondary | ICD-10-CM | POA: Diagnosis not present

## 2021-06-13 DIAGNOSIS — M9903 Segmental and somatic dysfunction of lumbar region: Secondary | ICD-10-CM | POA: Diagnosis not present

## 2021-06-13 DIAGNOSIS — M9902 Segmental and somatic dysfunction of thoracic region: Secondary | ICD-10-CM | POA: Diagnosis not present

## 2021-06-13 DIAGNOSIS — M5136 Other intervertebral disc degeneration, lumbar region: Secondary | ICD-10-CM | POA: Diagnosis not present

## 2021-06-13 DIAGNOSIS — M546 Pain in thoracic spine: Secondary | ICD-10-CM | POA: Diagnosis not present

## 2021-06-13 DIAGNOSIS — M9901 Segmental and somatic dysfunction of cervical region: Secondary | ICD-10-CM | POA: Diagnosis not present

## 2021-06-21 DIAGNOSIS — Z85820 Personal history of malignant melanoma of skin: Secondary | ICD-10-CM | POA: Diagnosis not present

## 2021-06-21 DIAGNOSIS — Z08 Encounter for follow-up examination after completed treatment for malignant neoplasm: Secondary | ICD-10-CM | POA: Diagnosis not present

## 2021-06-21 DIAGNOSIS — D225 Melanocytic nevi of trunk: Secondary | ICD-10-CM | POA: Diagnosis not present

## 2021-06-21 DIAGNOSIS — Z1283 Encounter for screening for malignant neoplasm of skin: Secondary | ICD-10-CM | POA: Diagnosis not present

## 2021-06-21 DIAGNOSIS — L814 Other melanin hyperpigmentation: Secondary | ICD-10-CM | POA: Diagnosis not present

## 2021-06-21 DIAGNOSIS — D485 Neoplasm of uncertain behavior of skin: Secondary | ICD-10-CM | POA: Diagnosis not present

## 2021-07-02 DIAGNOSIS — G4733 Obstructive sleep apnea (adult) (pediatric): Secondary | ICD-10-CM | POA: Diagnosis not present

## 2021-07-11 DIAGNOSIS — M9902 Segmental and somatic dysfunction of thoracic region: Secondary | ICD-10-CM | POA: Diagnosis not present

## 2021-07-11 DIAGNOSIS — M9901 Segmental and somatic dysfunction of cervical region: Secondary | ICD-10-CM | POA: Diagnosis not present

## 2021-07-11 DIAGNOSIS — M546 Pain in thoracic spine: Secondary | ICD-10-CM | POA: Diagnosis not present

## 2021-07-11 DIAGNOSIS — M5136 Other intervertebral disc degeneration, lumbar region: Secondary | ICD-10-CM | POA: Diagnosis not present

## 2021-07-11 DIAGNOSIS — M9903 Segmental and somatic dysfunction of lumbar region: Secondary | ICD-10-CM | POA: Diagnosis not present

## 2021-07-24 DIAGNOSIS — H35351 Cystoid macular degeneration, right eye: Secondary | ICD-10-CM | POA: Diagnosis not present

## 2021-08-07 DIAGNOSIS — H401111 Primary open-angle glaucoma, right eye, mild stage: Secondary | ICD-10-CM | POA: Diagnosis not present

## 2021-08-07 DIAGNOSIS — H40002 Preglaucoma, unspecified, left eye: Secondary | ICD-10-CM | POA: Diagnosis not present

## 2021-08-08 DIAGNOSIS — R7303 Prediabetes: Secondary | ICD-10-CM | POA: Diagnosis not present

## 2021-08-08 DIAGNOSIS — Z79899 Other long term (current) drug therapy: Secondary | ICD-10-CM | POA: Diagnosis not present

## 2021-08-15 DIAGNOSIS — M546 Pain in thoracic spine: Secondary | ICD-10-CM | POA: Diagnosis not present

## 2021-08-15 DIAGNOSIS — M9902 Segmental and somatic dysfunction of thoracic region: Secondary | ICD-10-CM | POA: Diagnosis not present

## 2021-08-15 DIAGNOSIS — M9901 Segmental and somatic dysfunction of cervical region: Secondary | ICD-10-CM | POA: Diagnosis not present

## 2021-08-15 DIAGNOSIS — M9903 Segmental and somatic dysfunction of lumbar region: Secondary | ICD-10-CM | POA: Diagnosis not present

## 2021-08-15 DIAGNOSIS — M5136 Other intervertebral disc degeneration, lumbar region: Secondary | ICD-10-CM | POA: Diagnosis not present

## 2021-08-21 DIAGNOSIS — H35351 Cystoid macular degeneration, right eye: Secondary | ICD-10-CM | POA: Diagnosis not present

## 2021-09-07 DIAGNOSIS — C6931 Malignant neoplasm of right choroid: Secondary | ICD-10-CM | POA: Diagnosis not present

## 2021-09-12 DIAGNOSIS — M9901 Segmental and somatic dysfunction of cervical region: Secondary | ICD-10-CM | POA: Diagnosis not present

## 2021-09-12 DIAGNOSIS — M9902 Segmental and somatic dysfunction of thoracic region: Secondary | ICD-10-CM | POA: Diagnosis not present

## 2021-09-12 DIAGNOSIS — M5136 Other intervertebral disc degeneration, lumbar region: Secondary | ICD-10-CM | POA: Diagnosis not present

## 2021-09-12 DIAGNOSIS — M9903 Segmental and somatic dysfunction of lumbar region: Secondary | ICD-10-CM | POA: Diagnosis not present

## 2021-09-12 DIAGNOSIS — M546 Pain in thoracic spine: Secondary | ICD-10-CM | POA: Diagnosis not present

## 2021-10-01 DIAGNOSIS — G4733 Obstructive sleep apnea (adult) (pediatric): Secondary | ICD-10-CM | POA: Diagnosis not present

## 2021-10-02 DIAGNOSIS — M65331 Trigger finger, right middle finger: Secondary | ICD-10-CM | POA: Diagnosis not present

## 2021-10-02 DIAGNOSIS — M65332 Trigger finger, left middle finger: Secondary | ICD-10-CM | POA: Diagnosis not present

## 2021-10-10 DIAGNOSIS — M5136 Other intervertebral disc degeneration, lumbar region: Secondary | ICD-10-CM | POA: Diagnosis not present

## 2021-10-10 DIAGNOSIS — M546 Pain in thoracic spine: Secondary | ICD-10-CM | POA: Diagnosis not present

## 2021-10-10 DIAGNOSIS — M9902 Segmental and somatic dysfunction of thoracic region: Secondary | ICD-10-CM | POA: Diagnosis not present

## 2021-10-10 DIAGNOSIS — M9903 Segmental and somatic dysfunction of lumbar region: Secondary | ICD-10-CM | POA: Diagnosis not present

## 2021-10-10 DIAGNOSIS — M9901 Segmental and somatic dysfunction of cervical region: Secondary | ICD-10-CM | POA: Diagnosis not present

## 2021-10-26 DIAGNOSIS — H35351 Cystoid macular degeneration, right eye: Secondary | ICD-10-CM | POA: Diagnosis not present

## 2021-11-14 DIAGNOSIS — M546 Pain in thoracic spine: Secondary | ICD-10-CM | POA: Diagnosis not present

## 2021-11-14 DIAGNOSIS — M9901 Segmental and somatic dysfunction of cervical region: Secondary | ICD-10-CM | POA: Diagnosis not present

## 2021-11-14 DIAGNOSIS — M5136 Other intervertebral disc degeneration, lumbar region: Secondary | ICD-10-CM | POA: Diagnosis not present

## 2021-11-14 DIAGNOSIS — M9902 Segmental and somatic dysfunction of thoracic region: Secondary | ICD-10-CM | POA: Diagnosis not present

## 2021-11-14 DIAGNOSIS — M9903 Segmental and somatic dysfunction of lumbar region: Secondary | ICD-10-CM | POA: Diagnosis not present

## 2021-12-12 DIAGNOSIS — M9903 Segmental and somatic dysfunction of lumbar region: Secondary | ICD-10-CM | POA: Diagnosis not present

## 2021-12-12 DIAGNOSIS — M5136 Other intervertebral disc degeneration, lumbar region: Secondary | ICD-10-CM | POA: Diagnosis not present

## 2021-12-12 DIAGNOSIS — M9902 Segmental and somatic dysfunction of thoracic region: Secondary | ICD-10-CM | POA: Diagnosis not present

## 2021-12-12 DIAGNOSIS — M546 Pain in thoracic spine: Secondary | ICD-10-CM | POA: Diagnosis not present

## 2021-12-12 DIAGNOSIS — M9901 Segmental and somatic dysfunction of cervical region: Secondary | ICD-10-CM | POA: Diagnosis not present

## 2021-12-31 DIAGNOSIS — G4733 Obstructive sleep apnea (adult) (pediatric): Secondary | ICD-10-CM | POA: Diagnosis not present

## 2022-01-01 DIAGNOSIS — H35351 Cystoid macular degeneration, right eye: Secondary | ICD-10-CM | POA: Diagnosis not present

## 2022-01-09 DIAGNOSIS — M5136 Other intervertebral disc degeneration, lumbar region: Secondary | ICD-10-CM | POA: Diagnosis not present

## 2022-01-09 DIAGNOSIS — M9903 Segmental and somatic dysfunction of lumbar region: Secondary | ICD-10-CM | POA: Diagnosis not present

## 2022-01-09 DIAGNOSIS — M546 Pain in thoracic spine: Secondary | ICD-10-CM | POA: Diagnosis not present

## 2022-01-09 DIAGNOSIS — M9901 Segmental and somatic dysfunction of cervical region: Secondary | ICD-10-CM | POA: Diagnosis not present

## 2022-01-09 DIAGNOSIS — M9902 Segmental and somatic dysfunction of thoracic region: Secondary | ICD-10-CM | POA: Diagnosis not present

## 2022-02-04 DIAGNOSIS — H401111 Primary open-angle glaucoma, right eye, mild stage: Secondary | ICD-10-CM | POA: Diagnosis not present

## 2022-02-05 DIAGNOSIS — H35351 Cystoid macular degeneration, right eye: Secondary | ICD-10-CM | POA: Diagnosis not present

## 2022-02-12 DIAGNOSIS — Z0001 Encounter for general adult medical examination with abnormal findings: Secondary | ICD-10-CM | POA: Diagnosis not present

## 2022-02-12 DIAGNOSIS — G4733 Obstructive sleep apnea (adult) (pediatric): Secondary | ICD-10-CM | POA: Diagnosis not present

## 2022-02-12 DIAGNOSIS — I10 Essential (primary) hypertension: Secondary | ICD-10-CM | POA: Diagnosis not present

## 2022-02-12 DIAGNOSIS — R7303 Prediabetes: Secondary | ICD-10-CM | POA: Diagnosis not present

## 2022-02-12 DIAGNOSIS — E7800 Pure hypercholesterolemia, unspecified: Secondary | ICD-10-CM | POA: Diagnosis not present

## 2022-02-12 DIAGNOSIS — Z79899 Other long term (current) drug therapy: Secondary | ICD-10-CM | POA: Diagnosis not present

## 2022-02-12 DIAGNOSIS — H26491 Other secondary cataract, right eye: Secondary | ICD-10-CM | POA: Diagnosis not present

## 2022-02-12 DIAGNOSIS — Z125 Encounter for screening for malignant neoplasm of prostate: Secondary | ICD-10-CM | POA: Diagnosis not present

## 2022-02-13 DIAGNOSIS — M9902 Segmental and somatic dysfunction of thoracic region: Secondary | ICD-10-CM | POA: Diagnosis not present

## 2022-02-13 DIAGNOSIS — M5136 Other intervertebral disc degeneration, lumbar region: Secondary | ICD-10-CM | POA: Diagnosis not present

## 2022-02-13 DIAGNOSIS — M546 Pain in thoracic spine: Secondary | ICD-10-CM | POA: Diagnosis not present

## 2022-02-13 DIAGNOSIS — M9903 Segmental and somatic dysfunction of lumbar region: Secondary | ICD-10-CM | POA: Diagnosis not present

## 2022-02-13 DIAGNOSIS — M9901 Segmental and somatic dysfunction of cervical region: Secondary | ICD-10-CM | POA: Diagnosis not present

## 2022-03-13 DIAGNOSIS — M5136 Other intervertebral disc degeneration, lumbar region: Secondary | ICD-10-CM | POA: Diagnosis not present

## 2022-03-13 DIAGNOSIS — M9902 Segmental and somatic dysfunction of thoracic region: Secondary | ICD-10-CM | POA: Diagnosis not present

## 2022-03-13 DIAGNOSIS — M9903 Segmental and somatic dysfunction of lumbar region: Secondary | ICD-10-CM | POA: Diagnosis not present

## 2022-03-13 DIAGNOSIS — M546 Pain in thoracic spine: Secondary | ICD-10-CM | POA: Diagnosis not present

## 2022-03-13 DIAGNOSIS — M9901 Segmental and somatic dysfunction of cervical region: Secondary | ICD-10-CM | POA: Diagnosis not present

## 2022-03-31 DIAGNOSIS — G4733 Obstructive sleep apnea (adult) (pediatric): Secondary | ICD-10-CM | POA: Diagnosis not present

## 2022-04-02 DIAGNOSIS — H35351 Cystoid macular degeneration, right eye: Secondary | ICD-10-CM | POA: Diagnosis not present

## 2022-04-17 DIAGNOSIS — M546 Pain in thoracic spine: Secondary | ICD-10-CM | POA: Diagnosis not present

## 2022-04-17 DIAGNOSIS — M5136 Other intervertebral disc degeneration, lumbar region: Secondary | ICD-10-CM | POA: Diagnosis not present

## 2022-04-17 DIAGNOSIS — M9902 Segmental and somatic dysfunction of thoracic region: Secondary | ICD-10-CM | POA: Diagnosis not present

## 2022-04-17 DIAGNOSIS — M9903 Segmental and somatic dysfunction of lumbar region: Secondary | ICD-10-CM | POA: Diagnosis not present

## 2022-04-17 DIAGNOSIS — M9901 Segmental and somatic dysfunction of cervical region: Secondary | ICD-10-CM | POA: Diagnosis not present

## 2022-05-14 DIAGNOSIS — H35351 Cystoid macular degeneration, right eye: Secondary | ICD-10-CM | POA: Diagnosis not present

## 2022-05-15 DIAGNOSIS — M546 Pain in thoracic spine: Secondary | ICD-10-CM | POA: Diagnosis not present

## 2022-05-15 DIAGNOSIS — M9903 Segmental and somatic dysfunction of lumbar region: Secondary | ICD-10-CM | POA: Diagnosis not present

## 2022-05-15 DIAGNOSIS — M9901 Segmental and somatic dysfunction of cervical region: Secondary | ICD-10-CM | POA: Diagnosis not present

## 2022-05-15 DIAGNOSIS — M5136 Other intervertebral disc degeneration, lumbar region: Secondary | ICD-10-CM | POA: Diagnosis not present

## 2022-05-15 DIAGNOSIS — M9902 Segmental and somatic dysfunction of thoracic region: Secondary | ICD-10-CM | POA: Diagnosis not present

## 2022-06-12 DIAGNOSIS — M9903 Segmental and somatic dysfunction of lumbar region: Secondary | ICD-10-CM | POA: Diagnosis not present

## 2022-06-12 DIAGNOSIS — M5136 Other intervertebral disc degeneration, lumbar region: Secondary | ICD-10-CM | POA: Diagnosis not present

## 2022-06-12 DIAGNOSIS — M546 Pain in thoracic spine: Secondary | ICD-10-CM | POA: Diagnosis not present

## 2022-06-12 DIAGNOSIS — M9901 Segmental and somatic dysfunction of cervical region: Secondary | ICD-10-CM | POA: Diagnosis not present

## 2022-06-12 DIAGNOSIS — M9902 Segmental and somatic dysfunction of thoracic region: Secondary | ICD-10-CM | POA: Diagnosis not present

## 2022-06-30 DIAGNOSIS — G4733 Obstructive sleep apnea (adult) (pediatric): Secondary | ICD-10-CM | POA: Diagnosis not present

## 2022-07-08 DIAGNOSIS — H35351 Cystoid macular degeneration, right eye: Secondary | ICD-10-CM | POA: Diagnosis not present

## 2022-07-11 DIAGNOSIS — M9901 Segmental and somatic dysfunction of cervical region: Secondary | ICD-10-CM | POA: Diagnosis not present

## 2022-07-11 DIAGNOSIS — M9902 Segmental and somatic dysfunction of thoracic region: Secondary | ICD-10-CM | POA: Diagnosis not present

## 2022-07-11 DIAGNOSIS — M546 Pain in thoracic spine: Secondary | ICD-10-CM | POA: Diagnosis not present

## 2022-07-11 DIAGNOSIS — M9903 Segmental and somatic dysfunction of lumbar region: Secondary | ICD-10-CM | POA: Diagnosis not present

## 2022-07-11 DIAGNOSIS — M5136 Other intervertebral disc degeneration, lumbar region: Secondary | ICD-10-CM | POA: Diagnosis not present

## 2022-07-24 DIAGNOSIS — D225 Melanocytic nevi of trunk: Secondary | ICD-10-CM | POA: Diagnosis not present

## 2022-07-24 DIAGNOSIS — Z1283 Encounter for screening for malignant neoplasm of skin: Secondary | ICD-10-CM | POA: Diagnosis not present

## 2022-07-24 DIAGNOSIS — Z85820 Personal history of malignant melanoma of skin: Secondary | ICD-10-CM | POA: Diagnosis not present

## 2022-07-24 DIAGNOSIS — Z08 Encounter for follow-up examination after completed treatment for malignant neoplasm: Secondary | ICD-10-CM | POA: Diagnosis not present

## 2022-08-14 DIAGNOSIS — M9903 Segmental and somatic dysfunction of lumbar region: Secondary | ICD-10-CM | POA: Diagnosis not present

## 2022-08-14 DIAGNOSIS — M9902 Segmental and somatic dysfunction of thoracic region: Secondary | ICD-10-CM | POA: Diagnosis not present

## 2022-08-14 DIAGNOSIS — M5136 Other intervertebral disc degeneration, lumbar region: Secondary | ICD-10-CM | POA: Diagnosis not present

## 2022-08-14 DIAGNOSIS — M9901 Segmental and somatic dysfunction of cervical region: Secondary | ICD-10-CM | POA: Diagnosis not present

## 2022-08-14 DIAGNOSIS — M546 Pain in thoracic spine: Secondary | ICD-10-CM | POA: Diagnosis not present

## 2022-08-15 DIAGNOSIS — H401111 Primary open-angle glaucoma, right eye, mild stage: Secondary | ICD-10-CM | POA: Diagnosis not present

## 2022-08-19 DIAGNOSIS — H2512 Age-related nuclear cataract, left eye: Secondary | ICD-10-CM | POA: Diagnosis not present

## 2022-08-19 DIAGNOSIS — H401111 Primary open-angle glaucoma, right eye, mild stage: Secondary | ICD-10-CM | POA: Diagnosis not present

## 2022-08-19 DIAGNOSIS — H40002 Preglaucoma, unspecified, left eye: Secondary | ICD-10-CM | POA: Diagnosis not present

## 2022-08-30 ENCOUNTER — Telehealth: Payer: Self-pay | Admitting: Gastroenterology

## 2022-08-30 NOTE — Telephone Encounter (Signed)
I am OK with this. Patient's recall should be placed in my name for 2029 Colonoscopy. He can be set up in clinic to discuss any other issues that he may have or with one of our APPs. Thanks. GM

## 2022-08-30 NOTE — Telephone Encounter (Signed)
Hi Dr. Meridee Score,    Patient is requesting to have his care transferred over specifically to you. Has previous history with [facility] and last had a colonoscopy in 2022. States his previous provider has retired so he is in need of a new one. His records are in [facility] for you to review and advise on scheduling.     Thank you.

## 2022-09-03 NOTE — Telephone Encounter (Signed)
Called patient to schedule. States he has no current symptoms to be seen for. Will give [place] a call to schedule if symptoms do develop or when it is time for colonoscopy.

## 2022-09-09 DIAGNOSIS — H35351 Cystoid macular degeneration, right eye: Secondary | ICD-10-CM | POA: Diagnosis not present

## 2022-09-13 DIAGNOSIS — M546 Pain in thoracic spine: Secondary | ICD-10-CM | POA: Diagnosis not present

## 2022-09-13 DIAGNOSIS — M9903 Segmental and somatic dysfunction of lumbar region: Secondary | ICD-10-CM | POA: Diagnosis not present

## 2022-09-13 DIAGNOSIS — M9901 Segmental and somatic dysfunction of cervical region: Secondary | ICD-10-CM | POA: Diagnosis not present

## 2022-09-13 DIAGNOSIS — M5136 Other intervertebral disc degeneration, lumbar region: Secondary | ICD-10-CM | POA: Diagnosis not present

## 2022-09-13 DIAGNOSIS — M9902 Segmental and somatic dysfunction of thoracic region: Secondary | ICD-10-CM | POA: Diagnosis not present

## 2022-09-29 DIAGNOSIS — G4733 Obstructive sleep apnea (adult) (pediatric): Secondary | ICD-10-CM | POA: Diagnosis not present

## 2022-10-16 DIAGNOSIS — M9902 Segmental and somatic dysfunction of thoracic region: Secondary | ICD-10-CM | POA: Diagnosis not present

## 2022-10-16 DIAGNOSIS — M546 Pain in thoracic spine: Secondary | ICD-10-CM | POA: Diagnosis not present

## 2022-10-16 DIAGNOSIS — M9903 Segmental and somatic dysfunction of lumbar region: Secondary | ICD-10-CM | POA: Diagnosis not present

## 2022-10-16 DIAGNOSIS — M5136 Other intervertebral disc degeneration, lumbar region: Secondary | ICD-10-CM | POA: Diagnosis not present

## 2022-10-16 DIAGNOSIS — M9901 Segmental and somatic dysfunction of cervical region: Secondary | ICD-10-CM | POA: Diagnosis not present

## 2022-11-12 DIAGNOSIS — H35351 Cystoid macular degeneration, right eye: Secondary | ICD-10-CM | POA: Diagnosis not present

## 2022-11-15 DIAGNOSIS — M546 Pain in thoracic spine: Secondary | ICD-10-CM | POA: Diagnosis not present

## 2022-11-15 DIAGNOSIS — M9902 Segmental and somatic dysfunction of thoracic region: Secondary | ICD-10-CM | POA: Diagnosis not present

## 2022-11-15 DIAGNOSIS — M9901 Segmental and somatic dysfunction of cervical region: Secondary | ICD-10-CM | POA: Diagnosis not present

## 2022-11-15 DIAGNOSIS — M5136 Other intervertebral disc degeneration, lumbar region: Secondary | ICD-10-CM | POA: Diagnosis not present

## 2022-11-15 DIAGNOSIS — M9903 Segmental and somatic dysfunction of lumbar region: Secondary | ICD-10-CM | POA: Diagnosis not present

## 2022-12-11 DIAGNOSIS — M62830 Muscle spasm of back: Secondary | ICD-10-CM | POA: Diagnosis not present

## 2022-12-11 DIAGNOSIS — M546 Pain in thoracic spine: Secondary | ICD-10-CM | POA: Diagnosis not present

## 2022-12-11 DIAGNOSIS — M9902 Segmental and somatic dysfunction of thoracic region: Secondary | ICD-10-CM | POA: Diagnosis not present

## 2022-12-11 DIAGNOSIS — M9901 Segmental and somatic dysfunction of cervical region: Secondary | ICD-10-CM | POA: Diagnosis not present

## 2022-12-11 DIAGNOSIS — M9903 Segmental and somatic dysfunction of lumbar region: Secondary | ICD-10-CM | POA: Diagnosis not present

## 2022-12-29 DIAGNOSIS — G4733 Obstructive sleep apnea (adult) (pediatric): Secondary | ICD-10-CM | POA: Diagnosis not present

## 2022-12-31 DIAGNOSIS — S6391XD Sprain of unspecified part of right wrist and hand, subsequent encounter: Secondary | ICD-10-CM | POA: Diagnosis not present

## 2023-01-09 ENCOUNTER — Ambulatory Visit: Payer: PPO | Admitting: Podiatry

## 2023-01-09 ENCOUNTER — Encounter: Payer: Self-pay | Admitting: Podiatry

## 2023-01-09 DIAGNOSIS — L600 Ingrowing nail: Secondary | ICD-10-CM | POA: Diagnosis not present

## 2023-01-09 MED ORDER — NEOMYCIN-POLYMYXIN-HC 1 % OT SOLN: OTIC | 1 refills | Status: DC

## 2023-01-09 NOTE — Patient Instructions (Signed)

## 2023-01-11 NOTE — Progress Notes (Signed)
Subjective:  Patient ID: Justin Montes, male    DOB: Aug 13, 1950,  MRN: [id number] HPI Chief Complaint  Patient presents with   Toe Pain    Hallux left - lateral border, tender x weeks, wife usually keeps nails trimmed, nail is thicker than right   New Patient (Initial Visit)    72 y.o. male presents with the above complaint.   ROS: Denies fever chills nausea vomit muscle aches pains calf pain back pain chest pain shortness of breath.  Past Medical History:  Diagnosis Date   Arthritis    fingers   Cancer (HCC)    melona of the right eye   Cataract    Chronic kidney disease    kidney stone age 89   GERD (gastroesophageal reflux disease)    Hyperlipemia    Hyperlipidemia, unspecified    Hypertension    Malignant neoplasm of unspecified site of unspecified eye (HCC)    Obesity, unspecified    Obstructive sleep apnea (adult) (pediatric)    Sleep apnea    10 on CPAP   Unspecified asthma, uncomplicated    Unspecified glaucoma    Past Surgical History:  Procedure Laterality Date   APPENDECTOMY     age 58   CATARACT EXTRACTION W/PHACO Right 10/21/2018   Procedure: CATARACT EXTRACTION PHACO AND INTRAOCULAR LENS PLACEMENT (IOC)  RIGHT;  Surgeon: Lockie Mola, MD;  Location: [facility];  Service: Ophthalmology;  Laterality: Right;   COLONOSCOPY N/A 12/31/2012   Procedure: COLONOSCOPY;  Surgeon: Malissa Hippo, MD;  Location: [facility];  Service: Endoscopy;  Laterality: N/A;  730   COLONOSCOPY N/A 03/16/2020   Procedure: COLONOSCOPY;  Surgeon: Malissa Hippo, MD;  Location: [facility];  Service: Endoscopy;  Laterality: N/A;  11:15   EYE SURGERY     POLYPECTOMY  03/16/2020   Procedure: POLYPECTOMY INTESTINAL;  Surgeon: Malissa Hippo, MD;  Location: [facility];  Service: Endoscopy;;   TRIGGER FINGER RELEASE     right and left thumbs   VASECTOMY      Current Outpatient Medications:    NEOMYCIN-POLYMYXIN-HYDROCORTISONE (CORTISPORIN) 1 % SOLN OTIC  solution, Apply 1-2 drops to toe BID after soaking, Disp: 10 mL, Rfl: 1   timolol (TIMOPTIC) 0.5 % ophthalmic solution, 1 drop 2 (two) times daily., Disp: , Rfl:    Ascorbic Acid (VITAMIN C) 1000 MG tablet, Take 1,000 mg by mouth daily., Disp: , Rfl:    atorvastatin (LIPITOR) 20 MG tablet, Take 1 tablet (20 mg total) by mouth daily., Disp: 90 tablet, Rfl: 1   Bevacizumab (AVASTIN IV), Inject into the vein See admin instructions. Gets every 10 weeks, Disp: , Rfl:    cholecalciferol (VITAMIN D) 25 MCG (1000 UNIT) tablet, Take 1,000 Units by mouth daily., Disp: , Rfl:    Coenzyme Q10 (CO Q-10) 200 MG CAPS, Take 200 mg by mouth daily., Disp: , Rfl:    Glucosamine-Chondroit-Vit C-Mn (GLUCOSAMINE-CHONDROITIN) TABS, Take 1 tablet by mouth 2 (two) times daily. Strength: 1500mg /1200mg , Disp: , Rfl:    KRILL OIL PO, Take 350 mg by mouth daily. Mega Red krill capsules, Disp: , Rfl:    losartan (COZAAR) 50 MG tablet, Take 1 tablet (50 mg total) by mouth 2 (two) times daily. (Patient taking differently: Take 100 mg by mouth daily.), Disp: 90 tablet, Rfl: 1   multivitamin (ONE-A-DAY MEN'S) TABS, Take 1 tablet by mouth every morning., Disp: , Rfl:    timolol (BETIMOL) 0.5 % ophthalmic solution, Place 1 drop into  both eyes 2 (two) times daily., Disp: , Rfl:   Allergies  Allergen Reactions   Bee Venom Hives and Swelling   Brimonidine Tartrate Other (See Comments)    Red and watery eyes   Hydrocodone Nausea Only   Review of Systems Objective:  There were no vitals filed for this visit.  General: Well developed, nourished, in no acute distress, alert and oriented x3   Dermatological: Skin is warm, dry and supple bilateral. Nails x 10 are well maintained; remaining integument appears unremarkable at this time. There are no open sores, no preulcerative lesions, no rash or signs of infection present.  Sharply abraded toenail tibial-fibular border of the hallux left.  Particularly the fibular border is the most  painful.  There is no purulence no malodor.  Vascular: Dorsalis Pedis artery and Posterior Tibial artery pedal pulses are 2/4 bilateral with immedate capillary fill time. Pedal hair growth present. No varicosities and no lower extremity edema present bilateral.   Neruologic: Grossly intact via light touch bilateral. Vibratory intact via tuning fork bilateral. Protective threshold with Semmes Wienstein monofilament intact to all pedal sites bilateral. Patellar and Achilles deep tendon reflexes 2+ bilateral. No Babinski or clonus noted bilateral.   Musculoskeletal: No gross boney pedal deformities bilateral. No pain, crepitus, or limitation noted with foot and ankle range of motion bilateral. Muscular strength 5/5 in all groups tested bilateral.  Gait: Unassisted, Nonantalgic.    Radiographs:  None taken  Assessment & Plan:   Assessment: Ingrown toenail hallux right  Plan: Chemical matricectomy performed today with phenol after local anesthetic was administered was given both oral written home-going structure for the care and soaking of the toe.  He was also provided with a prescription for Cortisporin Otic to be applied twice daily after soaking.  I like to follow-up with him in 2 to 3 weeks to make sure he is healing well.  Questions or concerns he will notify us immediately.     Mary-Anne Polizzi [place], [place]

## 2023-01-15 ENCOUNTER — Telehealth: Payer: Self-pay

## 2023-01-15 DIAGNOSIS — M546 Pain in thoracic spine: Secondary | ICD-10-CM | POA: Diagnosis not present

## 2023-01-15 DIAGNOSIS — M9902 Segmental and somatic dysfunction of thoracic region: Secondary | ICD-10-CM | POA: Diagnosis not present

## 2023-01-15 DIAGNOSIS — M9903 Segmental and somatic dysfunction of lumbar region: Secondary | ICD-10-CM | POA: Diagnosis not present

## 2023-01-15 DIAGNOSIS — M9901 Segmental and somatic dysfunction of cervical region: Secondary | ICD-10-CM | POA: Diagnosis not present

## 2023-01-15 DIAGNOSIS — M62830 Muscle spasm of back: Secondary | ICD-10-CM | POA: Diagnosis not present

## 2023-01-15 NOTE — Telephone Encounter (Signed)
Patient called on Monday to report that his toe is red, since Saturday. No pain, no swelling. Spoke to the patient today. The redness has faded. Advised to continue daily care and to please call back if his condition worsens Thanks

## 2023-01-21 ENCOUNTER — Telehealth: Payer: Self-pay

## 2023-01-21 DIAGNOSIS — H401111 Primary open-angle glaucoma, right eye, mild stage: Secondary | ICD-10-CM | POA: Diagnosis not present

## 2023-01-21 DIAGNOSIS — C6931 Malignant neoplasm of right choroid: Secondary | ICD-10-CM | POA: Diagnosis not present

## 2023-01-21 DIAGNOSIS — H26491 Other secondary cataract, right eye: Secondary | ICD-10-CM | POA: Diagnosis not present

## 2023-01-21 DIAGNOSIS — H35351 Cystoid macular degeneration, right eye: Secondary | ICD-10-CM | POA: Diagnosis not present

## 2023-01-21 NOTE — Telephone Encounter (Signed)
Patient called and left a message. His toe is red, and he is developing little red dots around the base of his toe - he has an appointment scheduled 11/14 with Dr Hanley Hays

## 2023-01-23 ENCOUNTER — Encounter: Payer: Self-pay | Admitting: Podiatry

## 2023-01-23 ENCOUNTER — Ambulatory Visit: Payer: PPO | Admitting: Podiatry

## 2023-01-23 DIAGNOSIS — L03032 Cellulitis of left toe: Secondary | ICD-10-CM | POA: Diagnosis not present

## 2023-01-23 NOTE — Progress Notes (Signed)
Subjective:   Patient ID: Justin Montes, male   DOB: 72 y.o.   MRN: [id number]   HPI Patient presents stating that he has a toe on his left foot that he just wants to have checked he is concerned about spots and redness neurovascular   ROS      Objective:  Physical Exam  Status intact 3 weeks after having toenail removal borders left with irritation of the bed localized and irritation more proximal with spot formation     Assessment:  Condition consistent with reaction with possible low-grade paronychia     Plan:  H&P reviewed and he will use antibiotic soap if needed for soaks will continue to Band-Aid the toe I am going to try to hold off on antibiotic but might be necessary in future and discussed what to look for as far as pathology goes

## 2023-02-10 ENCOUNTER — Telehealth: Payer: Self-pay

## 2023-02-10 ENCOUNTER — Other Ambulatory Visit: Payer: Self-pay | Admitting: Podiatry

## 2023-02-10 MED ORDER — DOXYCYCLINE HYCLATE 100 MG PO TABS
100.0000 mg | ORAL_TABLET | Freq: Two times a day (BID) | ORAL | 0 refills | Status: DC
Start: 2023-02-10 — End: 2023-04-17

## 2023-02-10 NOTE — Telephone Encounter (Signed)
Called him in antibiotic

## 2023-02-10 NOTE — Telephone Encounter (Signed)
Patient called this morning - his toe is still red - does he need another appointment? Or does he need antibiotics called in (per last note) Please advise. Thanks

## 2023-02-12 DIAGNOSIS — M9902 Segmental and somatic dysfunction of thoracic region: Secondary | ICD-10-CM | POA: Diagnosis not present

## 2023-02-12 DIAGNOSIS — M9901 Segmental and somatic dysfunction of cervical region: Secondary | ICD-10-CM | POA: Diagnosis not present

## 2023-02-12 DIAGNOSIS — M9903 Segmental and somatic dysfunction of lumbar region: Secondary | ICD-10-CM | POA: Diagnosis not present

## 2023-02-12 DIAGNOSIS — M62830 Muscle spasm of back: Secondary | ICD-10-CM | POA: Diagnosis not present

## 2023-02-12 DIAGNOSIS — M546 Pain in thoracic spine: Secondary | ICD-10-CM | POA: Diagnosis not present

## 2023-02-18 DIAGNOSIS — H401111 Primary open-angle glaucoma, right eye, mild stage: Secondary | ICD-10-CM | POA: Diagnosis not present

## 2023-02-18 DIAGNOSIS — H40002 Preglaucoma, unspecified, left eye: Secondary | ICD-10-CM | POA: Diagnosis not present

## 2023-02-18 DIAGNOSIS — H2512 Age-related nuclear cataract, left eye: Secondary | ICD-10-CM | POA: Diagnosis not present

## 2023-02-18 DIAGNOSIS — Z961 Presence of intraocular lens: Secondary | ICD-10-CM | POA: Diagnosis not present

## 2023-02-20 DIAGNOSIS — M65351 Trigger finger, right little finger: Secondary | ICD-10-CM | POA: Diagnosis not present

## 2023-02-21 DIAGNOSIS — I10 Essential (primary) hypertension: Secondary | ICD-10-CM | POA: Diagnosis not present

## 2023-02-21 DIAGNOSIS — Z1331 Encounter for screening for depression: Secondary | ICD-10-CM | POA: Diagnosis not present

## 2023-02-21 DIAGNOSIS — E7800 Pure hypercholesterolemia, unspecified: Secondary | ICD-10-CM | POA: Diagnosis not present

## 2023-02-21 DIAGNOSIS — G4733 Obstructive sleep apnea (adult) (pediatric): Secondary | ICD-10-CM | POA: Diagnosis not present

## 2023-02-21 DIAGNOSIS — Z0001 Encounter for general adult medical examination with abnormal findings: Secondary | ICD-10-CM | POA: Diagnosis not present

## 2023-02-21 DIAGNOSIS — R7303 Prediabetes: Secondary | ICD-10-CM | POA: Diagnosis not present

## 2023-02-21 DIAGNOSIS — Z79899 Other long term (current) drug therapy: Secondary | ICD-10-CM | POA: Diagnosis not present

## 2023-03-11 ENCOUNTER — Telehealth: Payer: Self-pay

## 2023-03-11 NOTE — Telephone Encounter (Signed)
Patient called and left a message - he is still having trouble with that toe. We sent in antibiotics last time he called, which he has finished. The toe is still red and inflamed - it does not hurt. He's not sure what he should do next.

## 2023-03-18 ENCOUNTER — Ambulatory Visit: Payer: PPO | Admitting: Podiatry

## 2023-04-17 ENCOUNTER — Ambulatory Visit (INDEPENDENT_AMBULATORY_CARE_PROVIDER_SITE_OTHER): Payer: HMO | Admitting: Podiatry

## 2023-04-17 DIAGNOSIS — L03032 Cellulitis of left toe: Secondary | ICD-10-CM

## 2023-04-17 NOTE — Progress Notes (Signed)
 He presents today concerned about the ingrown toenail to his left hallux.  We did this back in October and he said the toe still turns a little bit red.  He is taking a round of antibiotics was prescribed by Dr. Magdalen and seems to be doing better at this point.  Objective: Vital signs are stable he is alert oriented x 3 the toe may have some mild erythema he does have a collection of debris along the nail fold that we had removed along the tibial border.  I debrided that.  He tolerated procedure well without complication.  Assessment: Well-healing matrixectomy hallux left.  Plan: Follow-up with me if any recurrent

## 2023-05-17 DIAGNOSIS — Z6838 Body mass index (BMI) 38.0-38.9, adult: Secondary | ICD-10-CM | POA: Diagnosis not present

## 2023-05-17 DIAGNOSIS — R030 Elevated blood-pressure reading, without diagnosis of hypertension: Secondary | ICD-10-CM | POA: Diagnosis not present

## 2023-05-17 DIAGNOSIS — E669 Obesity, unspecified: Secondary | ICD-10-CM | POA: Diagnosis not present

## 2023-05-17 DIAGNOSIS — L0291 Cutaneous abscess, unspecified: Secondary | ICD-10-CM | POA: Diagnosis not present

## 2023-05-21 DIAGNOSIS — M9902 Segmental and somatic dysfunction of thoracic region: Secondary | ICD-10-CM | POA: Diagnosis not present

## 2023-05-21 DIAGNOSIS — M62830 Muscle spasm of back: Secondary | ICD-10-CM | POA: Diagnosis not present

## 2023-05-21 DIAGNOSIS — M546 Pain in thoracic spine: Secondary | ICD-10-CM | POA: Diagnosis not present

## 2023-05-21 DIAGNOSIS — M9903 Segmental and somatic dysfunction of lumbar region: Secondary | ICD-10-CM | POA: Diagnosis not present

## 2023-05-21 DIAGNOSIS — M9901 Segmental and somatic dysfunction of cervical region: Secondary | ICD-10-CM | POA: Diagnosis not present

## 2023-05-27 DIAGNOSIS — H35351 Cystoid macular degeneration, right eye: Secondary | ICD-10-CM | POA: Diagnosis not present

## 2023-06-18 DIAGNOSIS — M546 Pain in thoracic spine: Secondary | ICD-10-CM | POA: Diagnosis not present

## 2023-06-18 DIAGNOSIS — M9901 Segmental and somatic dysfunction of cervical region: Secondary | ICD-10-CM | POA: Diagnosis not present

## 2023-06-18 DIAGNOSIS — M62830 Muscle spasm of back: Secondary | ICD-10-CM | POA: Diagnosis not present

## 2023-06-18 DIAGNOSIS — M9903 Segmental and somatic dysfunction of lumbar region: Secondary | ICD-10-CM | POA: Diagnosis not present

## 2023-06-18 DIAGNOSIS — M9902 Segmental and somatic dysfunction of thoracic region: Secondary | ICD-10-CM | POA: Diagnosis not present

## 2023-06-27 DIAGNOSIS — G4733 Obstructive sleep apnea (adult) (pediatric): Secondary | ICD-10-CM | POA: Diagnosis not present

## 2023-07-16 DIAGNOSIS — M9901 Segmental and somatic dysfunction of cervical region: Secondary | ICD-10-CM | POA: Diagnosis not present

## 2023-07-16 DIAGNOSIS — M546 Pain in thoracic spine: Secondary | ICD-10-CM | POA: Diagnosis not present

## 2023-07-16 DIAGNOSIS — M9903 Segmental and somatic dysfunction of lumbar region: Secondary | ICD-10-CM | POA: Diagnosis not present

## 2023-07-16 DIAGNOSIS — M9902 Segmental and somatic dysfunction of thoracic region: Secondary | ICD-10-CM | POA: Diagnosis not present

## 2023-07-16 DIAGNOSIS — M62830 Muscle spasm of back: Secondary | ICD-10-CM | POA: Diagnosis not present

## 2023-07-28 DIAGNOSIS — Z85820 Personal history of malignant melanoma of skin: Secondary | ICD-10-CM | POA: Diagnosis not present

## 2023-07-28 DIAGNOSIS — Z1283 Encounter for screening for malignant neoplasm of skin: Secondary | ICD-10-CM | POA: Diagnosis not present

## 2023-07-28 DIAGNOSIS — Z08 Encounter for follow-up examination after completed treatment for malignant neoplasm: Secondary | ICD-10-CM | POA: Diagnosis not present

## 2023-07-28 DIAGNOSIS — D225 Melanocytic nevi of trunk: Secondary | ICD-10-CM | POA: Diagnosis not present

## 2023-07-28 DIAGNOSIS — B078 Other viral warts: Secondary | ICD-10-CM | POA: Diagnosis not present

## 2023-07-29 DIAGNOSIS — H35351 Cystoid macular degeneration, right eye: Secondary | ICD-10-CM | POA: Diagnosis not present

## 2023-08-12 DIAGNOSIS — H401111 Primary open-angle glaucoma, right eye, mild stage: Secondary | ICD-10-CM | POA: Diagnosis not present

## 2023-08-13 DIAGNOSIS — M9901 Segmental and somatic dysfunction of cervical region: Secondary | ICD-10-CM | POA: Diagnosis not present

## 2023-08-13 DIAGNOSIS — M546 Pain in thoracic spine: Secondary | ICD-10-CM | POA: Diagnosis not present

## 2023-08-13 DIAGNOSIS — M9903 Segmental and somatic dysfunction of lumbar region: Secondary | ICD-10-CM | POA: Diagnosis not present

## 2023-08-13 DIAGNOSIS — M9902 Segmental and somatic dysfunction of thoracic region: Secondary | ICD-10-CM | POA: Diagnosis not present

## 2023-08-13 DIAGNOSIS — M62830 Muscle spasm of back: Secondary | ICD-10-CM | POA: Diagnosis not present

## 2023-08-19 DIAGNOSIS — H401111 Primary open-angle glaucoma, right eye, mild stage: Secondary | ICD-10-CM | POA: Diagnosis not present

## 2023-08-19 DIAGNOSIS — H40002 Preglaucoma, unspecified, left eye: Secondary | ICD-10-CM | POA: Diagnosis not present

## 2023-08-19 DIAGNOSIS — Z961 Presence of intraocular lens: Secondary | ICD-10-CM | POA: Diagnosis not present

## 2023-08-19 DIAGNOSIS — H35351 Cystoid macular degeneration, right eye: Secondary | ICD-10-CM | POA: Diagnosis not present

## 2023-08-22 DIAGNOSIS — Z79899 Other long term (current) drug therapy: Secondary | ICD-10-CM | POA: Diagnosis not present

## 2023-08-22 DIAGNOSIS — E7800 Pure hypercholesterolemia, unspecified: Secondary | ICD-10-CM | POA: Diagnosis not present

## 2023-08-22 DIAGNOSIS — R7303 Prediabetes: Secondary | ICD-10-CM | POA: Diagnosis not present

## 2023-08-22 DIAGNOSIS — I10 Essential (primary) hypertension: Secondary | ICD-10-CM | POA: Diagnosis not present

## 2023-09-09 DIAGNOSIS — H35351 Cystoid macular degeneration, right eye: Secondary | ICD-10-CM | POA: Diagnosis not present

## 2023-09-10 DIAGNOSIS — M62830 Muscle spasm of back: Secondary | ICD-10-CM | POA: Diagnosis not present

## 2023-09-10 DIAGNOSIS — M9902 Segmental and somatic dysfunction of thoracic region: Secondary | ICD-10-CM | POA: Diagnosis not present

## 2023-09-10 DIAGNOSIS — M9901 Segmental and somatic dysfunction of cervical region: Secondary | ICD-10-CM | POA: Diagnosis not present

## 2023-09-10 DIAGNOSIS — M9903 Segmental and somatic dysfunction of lumbar region: Secondary | ICD-10-CM | POA: Diagnosis not present

## 2023-09-10 DIAGNOSIS — M546 Pain in thoracic spine: Secondary | ICD-10-CM | POA: Diagnosis not present

## 2023-09-25 DIAGNOSIS — G4733 Obstructive sleep apnea (adult) (pediatric): Secondary | ICD-10-CM | POA: Diagnosis not present

## 2023-09-30 DIAGNOSIS — Z961 Presence of intraocular lens: Secondary | ICD-10-CM | POA: Diagnosis not present

## 2023-09-30 DIAGNOSIS — H40002 Preglaucoma, unspecified, left eye: Secondary | ICD-10-CM | POA: Diagnosis not present

## 2023-09-30 DIAGNOSIS — H401111 Primary open-angle glaucoma, right eye, mild stage: Secondary | ICD-10-CM | POA: Diagnosis not present

## 2023-09-30 DIAGNOSIS — H2512 Age-related nuclear cataract, left eye: Secondary | ICD-10-CM | POA: Diagnosis not present

## 2023-10-15 DIAGNOSIS — M9903 Segmental and somatic dysfunction of lumbar region: Secondary | ICD-10-CM | POA: Diagnosis not present

## 2023-10-15 DIAGNOSIS — M546 Pain in thoracic spine: Secondary | ICD-10-CM | POA: Diagnosis not present

## 2023-10-15 DIAGNOSIS — M62830 Muscle spasm of back: Secondary | ICD-10-CM | POA: Diagnosis not present

## 2023-10-15 DIAGNOSIS — M9901 Segmental and somatic dysfunction of cervical region: Secondary | ICD-10-CM | POA: Diagnosis not present

## 2023-10-15 DIAGNOSIS — M9902 Segmental and somatic dysfunction of thoracic region: Secondary | ICD-10-CM | POA: Diagnosis not present

## 2023-10-21 DIAGNOSIS — H35351 Cystoid macular degeneration, right eye: Secondary | ICD-10-CM | POA: Diagnosis not present

## 2023-11-03 ENCOUNTER — Ambulatory Visit (INDEPENDENT_AMBULATORY_CARE_PROVIDER_SITE_OTHER): Admitting: Podiatry

## 2023-11-03 DIAGNOSIS — L600 Ingrowing nail: Secondary | ICD-10-CM

## 2023-11-03 DIAGNOSIS — B351 Tinea unguium: Secondary | ICD-10-CM | POA: Diagnosis not present

## 2023-11-03 DIAGNOSIS — M79676 Pain in unspecified toe(s): Secondary | ICD-10-CM

## 2023-11-03 NOTE — Progress Notes (Signed)
 He presents today states that he and his wife are concerned about the way the nail looks like it is cracked near the end of the toes he is referring to his left hallux.  Objective: Vital signs are stable alert oriented x 3.  The toenail is growing out normally and the medial and lateral margin of the distalmost aspect are demonstrating onycholysis due to the matrixectomy.  Assessment: Well-healing toe mild onycholysis associated with the matrixectomy.  Plan: I debrided these edges all for him today which to ease his mind.  And I will follow-up with him as needed.

## 2023-11-05 DIAGNOSIS — M62830 Muscle spasm of back: Secondary | ICD-10-CM | POA: Diagnosis not present

## 2023-11-05 DIAGNOSIS — M546 Pain in thoracic spine: Secondary | ICD-10-CM | POA: Diagnosis not present

## 2023-11-05 DIAGNOSIS — M9901 Segmental and somatic dysfunction of cervical region: Secondary | ICD-10-CM | POA: Diagnosis not present

## 2023-11-05 DIAGNOSIS — M9903 Segmental and somatic dysfunction of lumbar region: Secondary | ICD-10-CM | POA: Diagnosis not present

## 2023-11-05 DIAGNOSIS — M9902 Segmental and somatic dysfunction of thoracic region: Secondary | ICD-10-CM | POA: Diagnosis not present

## 2023-11-12 DIAGNOSIS — M9901 Segmental and somatic dysfunction of cervical region: Secondary | ICD-10-CM | POA: Diagnosis not present

## 2023-11-12 DIAGNOSIS — M9902 Segmental and somatic dysfunction of thoracic region: Secondary | ICD-10-CM | POA: Diagnosis not present

## 2023-11-12 DIAGNOSIS — M62830 Muscle spasm of back: Secondary | ICD-10-CM | POA: Diagnosis not present

## 2023-11-12 DIAGNOSIS — M9903 Segmental and somatic dysfunction of lumbar region: Secondary | ICD-10-CM | POA: Diagnosis not present

## 2023-11-12 DIAGNOSIS — M546 Pain in thoracic spine: Secondary | ICD-10-CM | POA: Diagnosis not present

## 2023-12-05 DIAGNOSIS — H35351 Cystoid macular degeneration, right eye: Secondary | ICD-10-CM | POA: Diagnosis not present

## 2023-12-17 DIAGNOSIS — M546 Pain in thoracic spine: Secondary | ICD-10-CM | POA: Diagnosis not present

## 2023-12-17 DIAGNOSIS — M62830 Muscle spasm of back: Secondary | ICD-10-CM | POA: Diagnosis not present

## 2023-12-17 DIAGNOSIS — M9901 Segmental and somatic dysfunction of cervical region: Secondary | ICD-10-CM | POA: Diagnosis not present

## 2023-12-17 DIAGNOSIS — M9903 Segmental and somatic dysfunction of lumbar region: Secondary | ICD-10-CM | POA: Diagnosis not present

## 2023-12-17 DIAGNOSIS — M9902 Segmental and somatic dysfunction of thoracic region: Secondary | ICD-10-CM | POA: Diagnosis not present

## 2024-01-16 DIAGNOSIS — H35351 Cystoid macular degeneration, right eye: Secondary | ICD-10-CM | POA: Diagnosis not present

## 2024-01-19 DIAGNOSIS — M62830 Muscle spasm of back: Secondary | ICD-10-CM | POA: Diagnosis not present

## 2024-01-19 DIAGNOSIS — M546 Pain in thoracic spine: Secondary | ICD-10-CM | POA: Diagnosis not present

## 2024-01-19 DIAGNOSIS — M9901 Segmental and somatic dysfunction of cervical region: Secondary | ICD-10-CM | POA: Diagnosis not present

## 2024-01-19 DIAGNOSIS — M9902 Segmental and somatic dysfunction of thoracic region: Secondary | ICD-10-CM | POA: Diagnosis not present

## 2024-01-19 DIAGNOSIS — M9903 Segmental and somatic dysfunction of lumbar region: Secondary | ICD-10-CM | POA: Diagnosis not present

## 2024-02-18 DIAGNOSIS — M62830 Muscle spasm of back: Secondary | ICD-10-CM | POA: Diagnosis not present

## 2024-02-18 DIAGNOSIS — M9901 Segmental and somatic dysfunction of cervical region: Secondary | ICD-10-CM | POA: Diagnosis not present

## 2024-02-18 DIAGNOSIS — M546 Pain in thoracic spine: Secondary | ICD-10-CM | POA: Diagnosis not present

## 2024-02-18 DIAGNOSIS — M9903 Segmental and somatic dysfunction of lumbar region: Secondary | ICD-10-CM | POA: Diagnosis not present

## 2024-02-18 DIAGNOSIS — M9902 Segmental and somatic dysfunction of thoracic region: Secondary | ICD-10-CM | POA: Diagnosis not present
